# Patient Record
Sex: Female | Born: 1990 | Race: White | Hispanic: No | Marital: Single | Smoking: Former smoker
Health system: Southern US, Community
[De-identification: ages and names within clinical notes are randomized; demographics above are authoritative.]

## PROBLEM LIST (undated history)

## (undated) ENCOUNTER — Inpatient Hospital Stay (HOSPITAL_COMMUNITY): Payer: No Typology Code available for payment source

## (undated) DIAGNOSIS — N390 Urinary tract infection, site not specified: Secondary | ICD-10-CM

## (undated) DIAGNOSIS — F1110 Opioid abuse, uncomplicated: Secondary | ICD-10-CM

## (undated) HISTORY — PX: WISDOM TOOTH EXTRACTION: SHX21

## (undated) HISTORY — PX: INDUCED ABORTION: SHX677

---

## 2011-07-22 ENCOUNTER — Encounter (HOSPITAL_BASED_OUTPATIENT_CLINIC_OR_DEPARTMENT_OTHER): Payer: Self-pay | Admitting: *Deleted

## 2011-07-22 ENCOUNTER — Emergency Department (HOSPITAL_BASED_OUTPATIENT_CLINIC_OR_DEPARTMENT_OTHER)
Admission: EM | Admit: 2011-07-22 | Discharge: 2011-07-22 | Disposition: A | Discharge status: Home / Self Care | Payer: No Typology Code available for payment source | Attending: Emergency Medicine | Admitting: Emergency Medicine

## 2011-07-22 ENCOUNTER — Emergency Department (HOSPITAL_BASED_OUTPATIENT_CLINIC_OR_DEPARTMENT_OTHER): Payer: No Typology Code available for payment source

## 2011-07-22 DIAGNOSIS — M25519 Pain in unspecified shoulder: Secondary | ICD-10-CM | POA: Insufficient documentation

## 2011-07-22 DIAGNOSIS — S139XXA Sprain of joints and ligaments of unspecified parts of neck, initial encounter: Secondary | ICD-10-CM | POA: Insufficient documentation

## 2011-07-22 DIAGNOSIS — S161XXA Strain of muscle, fascia and tendon at neck level, initial encounter: Secondary | ICD-10-CM

## 2011-07-22 DIAGNOSIS — M549 Dorsalgia, unspecified: Secondary | ICD-10-CM | POA: Insufficient documentation

## 2011-07-22 DIAGNOSIS — Y92410 Unspecified street and highway as the place of occurrence of the external cause: Secondary | ICD-10-CM | POA: Insufficient documentation

## 2011-07-22 MED ORDER — HYDROCODONE-ACETAMINOPHEN 5-500 MG PO TABS: 1.0000 | ORAL_TABLET | Freq: Four times a day (QID) | ORAL | Status: AC | PRN

## 2011-07-22 MED ORDER — METHOCARBAMOL 500 MG PO TABS: 500.0000 mg | ORAL_TABLET | Freq: Two times a day (BID) | ORAL | Status: AC
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
## 2011-07-22 NOTE — ED Provider Notes (Signed)
Medical screening examination/treatment/procedure(s) were performed by non-physician practitioner and as supervising physician I was immediately available for consultation/collaboration.    Kerriann Kamphuis L Audelia Knape, MD 07/22/11 1820 

## 2011-11-30 ENCOUNTER — Encounter (HOSPITAL_COMMUNITY): Payer: Self-pay

## 2011-11-30 ENCOUNTER — Observation Stay (HOSPITAL_COMMUNITY)
Admission: EM | Admit: 2011-11-30 | Discharge: 2011-11-30 | Disposition: A | Discharge status: Home / Self Care | Payer: Self-pay | Attending: Emergency Medicine | Admitting: Emergency Medicine

## 2011-11-30 DIAGNOSIS — IMO0002 Reserved for concepts with insufficient information to code with codable children: Principal | ICD-10-CM | POA: Insufficient documentation

## 2011-11-30 DIAGNOSIS — L02419 Cutaneous abscess of limb, unspecified: Secondary | ICD-10-CM

## 2011-11-30 DIAGNOSIS — F1110 Opioid abuse, uncomplicated: Secondary | ICD-10-CM | POA: Insufficient documentation

## 2011-11-30 HISTORY — DX: Opioid abuse, uncomplicated: F11.10

## 2011-11-30 LAB — BASIC METABOLIC PANEL
CO2: 29 mEq/L (ref 19–32)
Calcium: 10.1 mg/dL (ref 8.4–10.5)
Creatinine, Ser: 0.7 mg/dL (ref 0.50–1.10)
Glucose, Bld: 97 mg/dL (ref 70–99)

## 2011-11-30 LAB — CBC WITH DIFFERENTIAL/PLATELET
Basophils Absolute: 0.1 10*3/uL (ref 0.0–0.1)
Eosinophils Relative: 1 % (ref 0–5)
HCT: 41.1 % (ref 36.0–46.0)
Lymphocytes Relative: 23 % (ref 12–46)
Lymphs Abs: 2.3 10*3/uL (ref 0.7–4.0)
MCV: 82.5 fL (ref 78.0–100.0)
Monocytes Absolute: 0.5 10*3/uL (ref 0.1–1.0)
RDW: 12.1 % (ref 11.5–15.5)
WBC: 9.7 10*3/uL (ref 4.0–10.5)

## 2011-11-30 MED ORDER — CLINDAMYCIN PHOSPHATE 600 MG/50ML IV SOLN
600.0000 mg | Freq: Three times a day (TID) | INTRAVENOUS | Status: DC
  Administered 2011-11-30: 600 mg via INTRAVENOUS
  Filled 2011-11-30: qty 50

## 2011-11-30 MED ORDER — ONDANSETRON HCL 4 MG/2ML IJ SOLN
4.0000 mg | Freq: Four times a day (QID) | INTRAMUSCULAR | Status: DC | PRN
  Administered 2011-11-30: 4 mg via INTRAVENOUS
  Filled 2011-11-30: qty 2

## 2011-11-30 MED ORDER — SODIUM CHLORIDE 0.9 % IV SOLN
Freq: Once | INTRAVENOUS | Status: AC
  Administered 2011-11-30: 14:00:00 via INTRAVENOUS

## 2011-11-30 MED ORDER — ZOLPIDEM TARTRATE 5 MG PO TABS: 5.0000 mg | ORAL_TABLET | Freq: Every evening | ORAL | Status: DC | PRN

## 2011-11-30 MED ORDER — CLINDAMYCIN PHOSPHATE 600 MG/50ML IV SOLN
600.0000 mg | Freq: Once | INTRAVENOUS | Status: AC
  Administered 2011-11-30: 600 mg via INTRAVENOUS
  Filled 2011-11-30: qty 50

## 2011-11-30 MED ORDER — MORPHINE SULFATE 4 MG/ML IJ SOLN
4.0000 mg | INTRAMUSCULAR | Status: DC | PRN
  Administered 2011-11-30: 4 mg via INTRAVENOUS
  Filled 2011-11-30: qty 1

## 2011-11-30 MED ORDER — LORAZEPAM 1 MG PO TABS
1.0000 mg | ORAL_TABLET | Freq: Once | ORAL | Status: AC
  Administered 2011-11-30: 1 mg via ORAL
  Filled 2011-11-30: qty 1

## 2011-11-30 MED ORDER — IBUPROFEN 800 MG PO TABS
800.0000 mg | ORAL_TABLET | Freq: Once | ORAL | Status: AC
  Administered 2011-11-30: 800 mg via ORAL
  Filled 2011-11-30: qty 1

## 2011-11-30 MED ORDER — ACETAMINOPHEN 325 MG PO TABS
650.0000 mg | ORAL_TABLET | ORAL | Status: DC | PRN
Start: 2011-11-30 — End: 2011-11-30

## 2011-11-30 MED ORDER — MORPHINE SULFATE 4 MG/ML IJ SOLN
4.0000 mg | Freq: Once | INTRAMUSCULAR | Status: AC
  Administered 2011-11-30: 4 mg via INTRAVENOUS
  Filled 2011-11-30: qty 1

## 2011-11-30 MED ORDER — CLINDAMYCIN HCL 150 MG PO CAPS: 300.0000 mg | ORAL_CAPSULE | Freq: Four times a day (QID) | ORAL | Status: DC

## 2011-11-30 MED ORDER — OXYCODONE-ACETAMINOPHEN 5-325 MG PO TABS
1.0000 | ORAL_TABLET | Freq: Once | ORAL | Status: AC
  Administered 2011-11-30: 1 via ORAL
  Filled 2011-11-30: qty 1

## 2011-11-30 NOTE — Progress Notes (Signed)
Utilization review completed.  

## 2011-11-30 NOTE — ED Provider Notes (Signed)
1500 Report received from [place] PA for this 21yo female with RUE abscess from IV drug abuse coming from pod A Dr. Bernette Mayers.  Patient is anxious and wants to leave.  I reassured her she needs to stay to get one more dose of IV clindamycin.  I also have her rx being filled by the pharmacy now to ensure she will have it.  Eating her lunch presently.  Abscess draining and we will do wound care and have her come back tomorrow for recheck.    Remi Haggard, NP 12/02/11 1601

## 2011-11-30 NOTE — ED Provider Notes (Signed)
History     CSN: [id number]  Arrival date & time 11/30/11  4098   First MD Initiated Contact with Patient 11/30/11 [phone]      Chief Complaint  Patient presents with  . Cellulitis    (Consider location/radiation/quality/duration/timing/severity/associated sxs/prior treatment) HPI  21 year old female with history of heroin abuse presents complaining of pain and swelling to the right forearm. Patient admits to injecting IV heroin to her extremities. Her last use was a week ago. For the past week she has been noticing swelling and redness to her right forearm. Describe pain as sharp and throbbing sensation worsening with pressure. Pain is gradual onset, persistent, and worsening. She denies fever, chills, headache, chest pain, shortness of breath. She reports she is currently in rehabilitation and is now at the methadone clinic. She was prescribed Septra for the skin infection but it has not improved. She denies prior history of abscess. She denies history of immunocompromise, such as HIV, hepatitis, or diabetes.  History reviewed. No pertinent past medical history.  History reviewed. No pertinent past surgical history.  History reviewed. No pertinent family history.  History  Substance Use Topics  . Smoking status: Current Every Day Smoker -- 0.5 packs/day  . Smokeless tobacco: Not on file  . Alcohol Use: No    OB History    Grav Para Term Preterm Abortions TAB SAB Ect Mult Living                  Review of Systems  Constitutional: Negative for fever.  HENT: Negative for neck stiffness.   Musculoskeletal: Negative for joint swelling.  Skin: Positive for rash.  Neurological: Negative for numbness.    Allergies  Review of patient's allergies indicates no known allergies.  Home Medications   Current Outpatient Rx  Name Route Sig Dispense Refill  . IBUPROFEN 200 MG PO TABS Oral Take 400 mg by mouth every 6 (six) hours as needed. Patient used this medication for her back  pain.    Marland Kitchen MELATONIN ER 3 MG PO TBCR Oral Take 1 tablet by mouth daily as needed. Patient used this medication to aid with sleep.      BP 126/84  Temp 98.3 F (36.8 C) (Oral)  Resp 18  SpO2 100%  Physical Exam  Nursing note and vitals reviewed. Constitutional: She is oriented to person, place, and time. She appears well-developed and well-nourished. No distress.  HENT:  Head: Atraumatic.  Eyes: Conjunctivae normal are normal.  Neck: Neck supple.  Neurological: She is alert and oriented to person, place, and time.  Skin: Skin is warm. Rash (R forearm: large area of induration, fluctuance, with surrounding erythema to mid dorsal region.  ttp) noted.  Psychiatric: She has a normal mood and affect.    ED Course  Procedures (including critical care time)  Labs Reviewed - No data to display No results found.   No diagnosis found.  INCISION AND DRAINAGE Performed by: Fayrene Helper Consent: Verbal consent obtained. Risks and benefits: risks, benefits and alternatives were discussed Type: abscess  Body area: R forearm, dorsal region  Anesthesia: local infiltration  Local anesthetic: lidocaine 2% w epinephrine  Anesthetic total: 6 ml  Complexity: complex Blunt dissection to break up loculations  Drainage: purulent  Drainage amount: large  Packing material: 1/4 in iodoform gauze  Patient tolerance: Patient tolerated the procedure well with no immediate complications.  1. Abscess cellulitis of R forearm   MDM  Abscess to R forearm at site of IVDU. Successful I&D.  Pt has surrounding cellulitic changes.  Margin were drawn, will start clinda IV and place pt on cellulitis protocol.  No systemic infection.  Discussed care with my attending.    8:39 AM Report given to CDU PA, who will continue cellulitis protocol.  Pt may be d/c once 2 doses of clinda IV given.  Return in 2 days for recheck and packing removal.    BP 126/84  Temp 98.3 F (36.8 C) (Oral)  Resp 18  SpO2  100%        Fayrene Helper, PA-C 11/30/11 [phone]

## 2011-11-30 NOTE — ED Notes (Signed)
Pt presents to ED w/large abscess to RPFA x1 week, pt reports hx of IV drug use, reports using that extremity to inject IV Heroin and noticed a "skin colored raised area" after. Pt reports she was in rehab and is now at the Methadone clinic, pt was prescribed Septra while in rehab. Center of tissue is black

## 2011-11-30 NOTE — ED Notes (Signed)
PA at the bedside to perform I&D of R forearm abscess. Pt tolerating without difficulty at the time.

## 2011-11-30 NOTE — ED Provider Notes (Signed)
9:19 AM Assumed care of patient in the CDU.  Patient is currently on the Cellulitis Protocol.  Plan is for the patient to be discharged home once two doses of IV Clindamycin have been completed as long as the area of Cellulitis does not worsen.  Patient will be discharged home with prescription for Clindamycin.  Patient presented today with a large abscess on her right forearm that had been present for the past week.  Abscess with surrounding cellulitis.  Abscess has been I & D by Fayrene Helper prior to arrival in the CDU.  Patient is an IV drug user.  Patient is afebrile.  No acute distress. Heart RRR, Lungs CTAB, Right forearm with abscess that is draining a small amount of purulent fluid with surrounding erythema and induration.  Neurovascularly intact.    12:30 PM Reassessed patient.  Patient is resting comfortably.  No acute distress.  Patient has completed one round of Clindamycin.  Area of cellulitis is well within the marked area.  Neurovascularly intact.  Will reassess patient after her second dose.  3:23 PM Patient signed out to Levy Sjogren, NP.  Patient is awaiting second dose of Clindamycin IV.  Pascal Lux Washburn, PA-C 11/30/11 1523

## 2011-11-30 NOTE — ED Provider Notes (Signed)
Medical screening examination/treatment/procedure(s) were performed by non-physician practitioner and as supervising physician I was immediately available for consultation/collaboration.   Patricia Watson B. Bernette Mayers, MD 11/30/11 [phone]

## 2011-12-02 ENCOUNTER — Emergency Department (HOSPITAL_COMMUNITY)
Admission: EM | Admit: 2011-12-02 | Discharge: 2011-12-02 | Disposition: A | Discharge status: Home / Self Care | Payer: Self-pay | Attending: Emergency Medicine | Admitting: Emergency Medicine

## 2011-12-02 ENCOUNTER — Encounter (HOSPITAL_COMMUNITY): Payer: Self-pay | Admitting: *Deleted

## 2011-12-02 DIAGNOSIS — IMO0002 Reserved for concepts with insufficient information to code with codable children: Secondary | ICD-10-CM | POA: Insufficient documentation

## 2011-12-02 DIAGNOSIS — F17200 Nicotine dependence, unspecified, uncomplicated: Secondary | ICD-10-CM | POA: Insufficient documentation

## 2011-12-02 DIAGNOSIS — Z4800 Encounter for change or removal of nonsurgical wound dressing: Secondary | ICD-10-CM | POA: Insufficient documentation

## 2011-12-02 NOTE — ED Provider Notes (Signed)
History     CSN: [id number]  Arrival date & time 12/02/11  2107   First MD Initiated Contact with Patient 12/02/11 2125      Chief Complaint  Patient presents with  . Wound Check    (Consider location/radiation/quality/duration/timing/severity/associated sxs/prior treatment) HPI Comments: 21 year old female presents the emergency department for packing removal and abscess drain on her right forearm 2 days ago. Denies having any problems with movement here. Denies fever, chills, nausea or vomiting. She is taking clindamycin as directed. Denies any drainage from the wound. States she's been keeping the wound dry and covered.  The history is provided by the patient.    Past Medical History  Diagnosis Date  . Narcotic abuse     History reviewed. No pertinent past surgical history.  History reviewed. No pertinent family history.  History  Substance Use Topics  . Smoking status: Current Every Day Smoker -- 0.5 packs/day  . Smokeless tobacco: Not on file  . Alcohol Use: No    OB History    Grav Para Term Preterm Abortions TAB SAB Ect Mult Living                  Review of Systems  Constitutional: Negative for fever and chills.  Gastrointestinal: Negative for nausea and vomiting.  Skin: Negative for color change.  Neurological: Negative for light-headedness.    Allergies  Review of patient's allergies indicates no known allergies.  Home Medications   Current Outpatient Rx  Name Route Sig Dispense Refill  . CLINDAMYCIN HCL 150 MG PO CAPS Oral Take 2 capsules (300 mg total) by mouth every 6 (six) hours. 80 capsule 0  . METHADONE HCL 5 MG PO TABS Oral Take 40 mg by mouth daily.       BP 101/60  Pulse 63  Temp 98.1 F (36.7 C) (Oral)  Resp 16  SpO2 100%  Physical Exam  Constitutional: She is oriented to person, place, and time. She appears well-developed and well-nourished. No distress.  HENT:  Head: Normocephalic and atraumatic.  Eyes: Conjunctivae normal  and EOM are normal.  Neck: Normal range of motion.  Cardiovascular: Normal rate, regular rhythm and normal heart sounds.   Pulmonary/Chest: Effort normal and breath sounds normal.  Neurological: She is alert and oriented to person, place, and time.  Skin: Skin is warm and dry. She is not diaphoretic.       1 cm diameter packed abscess without induration, drainage, or edema. Mild surrounding erythema. Packing removed and still without active drainage.   Psychiatric: She has a normal mood and affect. Her behavior is normal.    ED Course  Procedures (including critical care time)  Labs Reviewed - No data to display No results found.   1. Abscess packing removal       MDM  21 year old female with packing removed from abscess on right forearm. She didn't clindamycin. Erythema is much improved compared to the skin markings John at her last visit. No associated symptoms. She is afebrile in no apparent distress. She'll continue taking clindamycin.        Trevor Mace, PA-C 12/02/11 2146

## 2011-12-02 NOTE — ED Notes (Signed)
No rx.  Pt voiced understanding to f/u with PCP and return for worsening condition.  

## 2011-12-02 NOTE — ED Notes (Signed)
Pt states seen here for wound on right forearm. Pt wound was opened and packed pt told to come back to have packing removed.

## 2011-12-03 NOTE — ED Provider Notes (Signed)
Medical screening examination/treatment/procedure(s) were performed by non-physician practitioner and as supervising physician I was immediately available for consultation/collaboration.  Elaf Clauson, MD 12/03/11 0127 

## 2012-02-18 NOTE — L&D Delivery Note (Signed)
Delivery Note At 5:21 PM a viable female was delivered via Vaginal, Spontaneous Delivery (Presentation: Left Occiput Anterior).  APGAR: 8, 9; weight 6 lb 15 oz (3147 g).   Placenta status: Intact, Spontaneous.  Cord: 3 vessels with the following complications: None.    Anesthesia: Epidural  Episiotomy: None Lacerations: 2nd degree;Perineal Suture Repair: 3.0 vicryl Est. Blood Loss (mL): 500  Mom to postpartum.  Baby to nursery-stable.  Patricia Blackwell 11/18/2012, 7:11 PM

## 2012-04-03 ENCOUNTER — Encounter (HOSPITAL_BASED_OUTPATIENT_CLINIC_OR_DEPARTMENT_OTHER): Payer: Self-pay | Admitting: Emergency Medicine

## 2012-04-03 ENCOUNTER — Emergency Department (HOSPITAL_BASED_OUTPATIENT_CLINIC_OR_DEPARTMENT_OTHER): Payer: Medicaid Other

## 2012-04-03 ENCOUNTER — Emergency Department (HOSPITAL_BASED_OUTPATIENT_CLINIC_OR_DEPARTMENT_OTHER)
Admission: EM | Admit: 2012-04-03 | Discharge: 2012-04-03 | Disposition: A | Discharge status: Home / Self Care | Payer: Medicaid Other | Attending: Emergency Medicine | Admitting: Emergency Medicine

## 2012-04-03 DIAGNOSIS — R109 Unspecified abdominal pain: Secondary | ICD-10-CM | POA: Insufficient documentation

## 2012-04-03 DIAGNOSIS — O9989 Other specified diseases and conditions complicating pregnancy, childbirth and the puerperium: Secondary | ICD-10-CM | POA: Insufficient documentation

## 2012-04-03 DIAGNOSIS — O209 Hemorrhage in early pregnancy, unspecified: Secondary | ICD-10-CM

## 2012-04-03 DIAGNOSIS — F17200 Nicotine dependence, unspecified, uncomplicated: Secondary | ICD-10-CM | POA: Insufficient documentation

## 2012-04-03 DIAGNOSIS — Z79899 Other long term (current) drug therapy: Secondary | ICD-10-CM | POA: Insufficient documentation

## 2012-04-03 DIAGNOSIS — F1110 Opioid abuse, uncomplicated: Secondary | ICD-10-CM | POA: Insufficient documentation

## 2012-04-03 LAB — CBC WITH DIFFERENTIAL/PLATELET
Basophils Relative: 0 % (ref 0–1)
Eosinophils Absolute: 0.1 10*3/uL (ref 0.0–0.7)
HCT: 40.4 % (ref 36.0–46.0)
Hemoglobin: 14.8 g/dL (ref 12.0–15.0)
MCH: 29 pg (ref 26.0–34.0)
MCHC: 36.6 g/dL — ABNORMAL HIGH (ref 30.0–36.0)
Monocytes Absolute: 0.4 10*3/uL (ref 0.1–1.0)
Monocytes Relative: 5 % (ref 3–12)
Neutro Abs: 5.5 10*3/uL (ref 1.7–7.7)

## 2012-04-03 LAB — ABO/RH: Antibody Screen: NEGATIVE

## 2012-04-03 LAB — URINALYSIS, ROUTINE W REFLEX MICROSCOPIC
Glucose, UA: NEGATIVE mg/dL
Ketones, ur: 40 mg/dL — AB
Protein, ur: >300 mg/dL — AB

## 2012-04-03 LAB — WET PREP, GENITAL: Trich, Wet Prep: NONE SEEN

## 2012-04-03 LAB — URINE MICROSCOPIC-ADD ON

## 2012-04-03 MED ORDER — RHO D IMMUNE GLOBULIN 1500 UNIT/2ML IJ SOLN
INTRAMUSCULAR | Status: AC
  Administered 2012-04-03: 300 ug via INTRAMUSCULAR
  Filled 2012-04-03: qty 2

## 2012-04-03 MED ORDER — MORPHINE SULFATE 4 MG/ML IJ SOLN
4.0000 mg | Freq: Once | INTRAMUSCULAR | Status: AC
  Administered 2012-04-03: 4 mg via INTRAVENOUS
  Filled 2012-04-03: qty 1

## 2012-04-03 MED ORDER — SODIUM CHLORIDE 0.9 % IV SOLN
Freq: Once | INTRAVENOUS | Status: AC
  Administered 2012-04-03: 14:00:00 via INTRAVENOUS

## 2012-04-03 MED ORDER — RHO D IMMUNE GLOBULIN 1500 UNIT/2ML IJ SOLN
300.0000 ug | Freq: Once | INTRAMUSCULAR | Status: AC
  Administered 2012-04-03: 300 ug via INTRAMUSCULAR

## 2012-04-03 NOTE — ED Notes (Signed)
Pt is pregnant (unknown length of time, approx 6-8 weeks).  Pt states she started bleeding today, also having cramping.  Pt tearful, upset.

## 2012-04-03 NOTE — ED Provider Notes (Signed)
History     CSN: [id number]  Arrival date & time 04/03/12  1240   First MD Initiated Contact with Patient 04/03/12 1257      Chief Complaint  Patient presents with  . Vaginal Bleeding  . Abdominal Cramping    (Consider location/radiation/quality/duration/timing/severity/associated sxs/prior treatment) Patient is a 22 y.o. female presenting with vaginal bleeding. The history is provided by the patient. No language interpreter was used.  Vaginal Bleeding This is a new problem. The current episode started today. The problem occurs constantly. The problem has been gradually worsening. Associated symptoms include abdominal pain. Nothing aggravates the symptoms. She has tried nothing for the symptoms.   Pt reports she is having vaginal bleeding and severe pain.  Pt reports she has not had any prenatal care Past Medical History  Diagnosis Date  . Narcotic abuse     No past surgical history on file.  No family history on file.  History  Substance Use Topics  . Smoking status: Current Every Day Smoker -- 0.50 packs/day  . Smokeless tobacco: Not on file  . Alcohol Use: No    OB History   Grav Para Term Preterm Abortions TAB SAB Ect Mult Living   1               Review of Systems  Gastrointestinal: Positive for abdominal pain.  Genitourinary: Positive for vaginal bleeding.  All other systems reviewed and are negative.    Allergies  Review of patient's allergies indicates no known allergies.  Home Medications   Current Outpatient Rx  Name  Route  Sig  Dispense  Refill  . methadone (DOLOPHINE) 5 MG tablet   Oral   Take 40 mg by mouth daily.            BP 135/68  Pulse 100  Temp(Src) 98.5 F (36.9 C) (Oral)  Resp 16  Ht 5\' 4"  (1.626 m)  Wt 110 lb (49.896 kg)  BMI 18.87 kg/m2  SpO2 100%  LMP 06/25/2011  Physical Exam  Vitals reviewed. Constitutional: She is oriented to person, place, and time. She appears well-developed and well-nourished.  HENT:   Head: Normocephalic.  Right Ear: External ear normal.  Left Ear: External ear normal.  Mouth/Throat: Oropharynx is clear and moist.  Eyes: Pupils are equal, round, and reactive to light.  Neck: Normal range of motion.  Cardiovascular: Normal rate.   Pulmonary/Chest: Effort normal.  Abdominal: Soft.  Genitourinary: Vagina normal.  Small amount red vaginal blood, Os closed  Neurological: She is alert and oriented to person, place, and time.  Skin: Skin is warm.    ED Course  Procedures (including critical care time)  Labs Reviewed  URINALYSIS, ROUTINE W REFLEX MICROSCOPIC  PREGNANCY, URINE   No results found.   No diagnosis found.    MDM  I discussed with Dr. Eunice Blase gyn teaching service.   She advised to give pt rhogam.  Pt advised of possible impending SAB. Pt counseled on followup   Pt called and advised of uti.   Rx for amoxicillian 500mg  one po tid   Urine culture ordered     Elson Areas, PA 04/03/12 1704  Lonia Skinner [place], [place] 04/03/12 1745

## 2012-04-03 NOTE — ED Notes (Signed)
Pt discharged prior to receiving antibiotic prescription.  Called pt and verified pharmacy.  Called [facility] on [address], Amoxicillin 500 mg po three times a day for 10 days.  #30, no refills, for [place], [place].

## 2012-04-03 NOTE — ED Notes (Signed)
Patient ambulatory to restroom without difficulty or assistance.

## 2012-04-04 LAB — URINE CULTURE: Culture: NO GROWTH

## 2012-04-05 ENCOUNTER — Encounter (HOSPITAL_COMMUNITY): Payer: Self-pay | Admitting: *Deleted

## 2012-04-05 ENCOUNTER — Inpatient Hospital Stay (HOSPITAL_COMMUNITY)
Admission: AD | Admit: 2012-04-05 | Discharge: 2012-04-05 | Disposition: A | Discharge status: Home / Self Care | Payer: Medicaid Other | Source: Ambulatory Visit | Attending: Obstetrics & Gynecology | Admitting: Obstetrics & Gynecology

## 2012-04-05 DIAGNOSIS — R141 Gas pain: Secondary | ICD-10-CM | POA: Insufficient documentation

## 2012-04-05 DIAGNOSIS — R143 Flatulence: Secondary | ICD-10-CM | POA: Insufficient documentation

## 2012-04-05 DIAGNOSIS — N390 Urinary tract infection, site not specified: Secondary | ICD-10-CM

## 2012-04-05 DIAGNOSIS — R142 Eructation: Secondary | ICD-10-CM | POA: Insufficient documentation

## 2012-04-05 DIAGNOSIS — K5900 Constipation, unspecified: Secondary | ICD-10-CM | POA: Insufficient documentation

## 2012-04-05 HISTORY — DX: Urinary tract infection, site not specified: N39.0

## 2012-04-05 LAB — GC/CHLAMYDIA PROBE AMP
CT Probe RNA: NEGATIVE
GC Probe RNA: NEGATIVE

## 2012-04-05 MED ORDER — METRONIDAZOLE 500 MG PO TABS: 500.0000 mg | ORAL_TABLET | Freq: Two times a day (BID) | ORAL | Status: DC

## 2012-04-05 NOTE — MAU Note (Signed)
Pt seen at [facility] on the 15th.  For bleeding and cramping.  States is doing better small amt of spotting yesterday morning, none since.  Is taking the antibiotics for for the UTI.  Is feeling bloated today, constipation.

## 2012-04-05 NOTE — ED Provider Notes (Signed)
Medical screening examination/treatment/procedure(s) were performed by non-physician practitioner and as supervising physician I was immediately available for consultation/collaboration.   Joya Gaskins, MD 04/05/12 [phone]

## 2012-04-05 NOTE — MAU Provider Note (Signed)
  History     CSN: [id number]  Arrival date and time: 04/05/12 1142   None     Chief Complaint  Patient presents with  . Follow-up   HPI Pt is G3P0020 [redacted]w[redacted]d pregnant and presents for f/u after being seen at [facility] on  2/15 for bleeding and cramping.  She had a small amount of spotting yesterday morning and none since.  Pt denies any bleeding or cramping today.  Pt was told that she might be having a miscarriage and she was to f/u here.   Past Medical History  Diagnosis Date  . Narcotic abuse   . Urinary tract infection     Past Surgical History  Procedure Laterality Date  . Induced abortion      Family History  Problem Relation Age of Onset  . Heart disease Father     valve replaced  . Cancer Maternal Grandmother     lung  . Multiple sclerosis Paternal Grandmother     History  Substance Use Topics  . Smoking status: Former Smoker -- 0.50 packs/day    Types: Cigarettes  . Smokeless tobacco: Former Neurosurgeon    Quit date: 04/03/2012  . Alcohol Use: No    Allergies: No Known Allergies  Prescriptions prior to admission  Medication Sig Dispense Refill  . methadone (DOLOPHINE) 5 MG tablet Take 40 mg by mouth daily.         Review of Systems  Constitutional: Negative for fever and chills.  Gastrointestinal: Positive for constipation. Negative for nausea, vomiting, abdominal pain and diarrhea.  Genitourinary: Negative for dysuria, urgency and frequency.  Neurological: Negative for headaches.   Physical Exam   Blood pressure 119/78, pulse 75, temperature 98.4 F (36.9 C), temperature source Oral, resp. rate 18, last menstrual period 06/25/2011.  Physical Exam  Nursing note and vitals reviewed. Constitutional: She is oriented to person, place, and time. She appears well-developed and well-nourished.  HENT:  Head: Normocephalic.  Eyes: Pupils are equal, round, and reactive to light.  Neck: Normal range of motion. Neck supple.  Cardiovascular: Normal  rate.   Respiratory: Effort normal.  GI: Soft.  Musculoskeletal: Normal range of motion.  Neurological: She is alert and oriented to person, place, and time.  Skin: Skin is warm and dry.  Psychiatric: She has a normal mood and affect.    MAU Course  Procedures From 2/15: Clinical Data: Bleeding, cramping  OBSTETRIC <14 WK [place] AND TRANSVAGINAL OB US  Technique: Both transabdominal and transvaginal ultrasound  examinations were performed for complete evaluation of the  gestation as well as the maternal uterus, adnexal regions, and  pelvic cul-de-sac.  Comparison: None.  Findings: There is a single intrauterine gestation, 7 weeks 6 days  by crown-rump length. Fetal heart rate 169 beats per minute. No  subchorionic hemorrhage.  Ovaries are symmetric in size and echotexture. No adnexal mass.  No free fluid.  IMPRESSION:  7-week-6-day intrauterine pregnancy with fetal heart rate 169 beats  per minute. No visualize subchorionic hemorrhage.  Original Report Authenticated By: Charlett Nose, M.D.  Reviewed results of ultrasound from 2/15 with pt and mother- no evidence of impending miscarriage Assessment and Plan  Bleeding in pregnancy Normal ultrasound F/u for prenatal care- pt may elect to go to [facility] 04/05/2012, 12:16 PM

## 2012-04-07 NOTE — MAU Provider Note (Signed)
Attestation of Attending Supervision of Advanced Practitioner (CNM/NP): Evaluation and management procedures were performed by the Advanced Practitioner under my supervision and collaboration.  I have reviewed the Advanced Practitioner's note and chart, and I agree with the management and plan.  HARRAWAY-SMITH, Aalyiah Camberos 9:33 AM

## 2012-04-12 ENCOUNTER — Ambulatory Visit (INDEPENDENT_AMBULATORY_CARE_PROVIDER_SITE_OTHER): Payer: Medicaid Other | Admitting: Advanced Practice Midwife

## 2012-04-12 ENCOUNTER — Encounter: Payer: Self-pay | Admitting: Advanced Practice Midwife

## 2012-04-12 VITALS — BP 131/86 | Wt 114.0 lb

## 2012-04-12 DIAGNOSIS — Z3490 Encounter for supervision of normal pregnancy, unspecified, unspecified trimester: Secondary | ICD-10-CM | POA: Insufficient documentation

## 2012-04-12 DIAGNOSIS — Z3480 Encounter for supervision of other normal pregnancy, unspecified trimester: Secondary | ICD-10-CM

## 2012-04-12 LAB — HIV ANTIBODY (ROUTINE TESTING W REFLEX): HIV: NONREACTIVE

## 2012-04-12 MED ORDER — RANITIDINE HCL 150 MG PO TABS: 150.0000 mg | ORAL_TABLET | Freq: Two times a day (BID) | ORAL | Status: DC

## 2012-04-12 NOTE — Progress Notes (Signed)
   Subjective:    Patricia Blackwell is a [id number] [redacted]w[redacted]d being seen today for her first obstetrical visit. Patient is not sure if she intends to breast feed. Pregnancy history fully reviewed. She has been seen in MAU for bleeding in pregnancy, had a normal u/s at 7 weeks, no ongoing bleeding. Treated for BV and UTI at that time.   Patricia Blackwell has a history of narcotic dependence (opiods - pills and occasional heroin use), started Methadone in October of last year and is doing well. She also recently quit smoking cigarettes when she discovered that she was pregnant, as well as stopping drinking sodas. She states she is very excited and concerned about this pregnancy.   Patient reports fatigue, heartburn and constipation.  Filed Vitals:   04/12/12 0912  BP: 131/86  Weight: 114 lb (51.71 kg)    HISTORY: OB History   Grav Para Term Preterm Abortions TAB SAB Ect Mult Living   3 0 0 0 2 1 1    0     # Outc Date GA Lbr Len/2nd Wgt Sex Del Anes PTL Lv   1 TAB            2 SAB            3 CUR              Past Medical History  Diagnosis Date  . Narcotic abuse   . Urinary tract infection    Past Surgical History  Procedure Laterality Date  . Induced abortion     Family History  Problem Relation Age of Onset  . Heart disease Father     valve replaced  . Cancer Maternal Grandmother     lung  . Multiple sclerosis Paternal Grandmother      Exam    Uterus:   below SP  Pelvic Exam: Deferred, recent exam in MAU  System:     Skin: normal coloration and turgor, no rashes    Neurologic: oriented, normal mood   Extremities: normal strength, tone, and muscle mass       Mouth/Teeth mucous membranes moist, pharynx normal without lesions       Cardiovascular: regular rate and rhythm   Respiratory:  appears well, vitals normal, no respiratory distress, acyanotic, normal RR   Abdomen: soft, non-tender; bowel sounds normal; no masses,  no organomegaly          Assessment:    Pregnancy:  [id number] Patient Active Problem List  Diagnosis  . Supervision of normal pregnancy  . Narcotic dependence        Plan:     Initial labs drawn. Flu shot today Prenatal vitamins. Rx ranitidine for heartburn Problem list reviewed and updated. Genetic Screening discussed Integrated Screen and Quad Screen: undecided.  Ultrasound discussed; fetal survey: at around 20 weeks.  Follow up in 4 weeks.    Patricia Blackwell 04/12/2012

## 2012-04-12 NOTE — Progress Notes (Signed)
P - 92 - Pt would like scan done today if possible - Pt states has been constipated and has had heartburn - Pt has Pap Smear in 2013

## 2012-04-12 NOTE — Patient Instructions (Signed)
Pregnancy - First Trimester During sexual intercourse, millions of sperm go into the vagina. Only 1 sperm will penetrate and fertilize the female egg while it is in the Fallopian tube. One week later, the fertilized egg implants into the wall of the uterus. An embryo begins to develop into a baby. At 6 to 8 weeks, the eyes and face are formed and the heartbeat can be seen on ultrasound. At the end of 12 weeks (first trimester), all the baby's organs are formed. Now that you are pregnant, you will want to do everything you can to have a healthy baby. Two of the most important things are to get good prenatal care and follow your caregiver's instructions. Prenatal care is all the medical care you receive before the baby's birth. It is given to prevent, find, and treat problems during the pregnancy and childbirth. PRENATAL EXAMS  During prenatal visits, your weight, blood pressure and urine are checked. This is done to make sure you are healthy and progressing normally during the pregnancy.  A pregnant woman should gain 25 to 35 pounds during the pregnancy. However, if you are over weight or underweight, your caregiver will advise you regarding your weight.  Your caregiver will ask and answer questions for you.  Blood work, cervical cultures, other necessary tests and a Pap test are done during your prenatal exams. These tests are done to check on your health and the probable health of your baby. Tests are strongly recommended and done for HIV with your permission. This is the virus that causes AIDS. These tests are done because medications can be given to help prevent your baby from being born with this infection should you have been infected without knowing it. Blood work is also used to find out your blood type, previous infections and follow your blood levels (hemoglobin).  Low hemoglobin (anemia) is common during pregnancy. Iron and vitamins are given to help prevent this. Later in the pregnancy,  blood tests for diabetes will be done along with any other tests if any problems develop. You may need tests to make sure you and the baby are doing well.  You may need other tests to make sure you and the baby are doing well. CHANGES DURING THE FIRST TRIMESTER (THE FIRST 3 MONTHS OF PREGNANCY) Your body goes through many changes during pregnancy. They vary from person to person. Talk to your caregiver about changes you notice and are concerned about. Changes can include:  Your menstrual period stops.  The egg and sperm carry the genes that determine what you look like. Genes from you and your partner are forming a baby. The female genes determine whether the baby is a boy or a girl.  Your body increases in girth and you may feel bloated.  Feeling sick to your stomach (nauseous) and throwing up (vomiting). If the vomiting is uncontrollable, call your caregiver.  Your breasts will begin to enlarge and become tender.  Your nipples may stick out more and become darker.  The need to urinate more. Painful urination may mean you have a bladder infection.  Tiring easily.  Loss of appetite.  Cravings for certain kinds of food.  At first, you may gain or lose a couple of pounds.  You may have changes in your emotions from day to day (excited to be pregnant or concerned something may go wrong with the pregnancy and baby).  You may have more vivid and strange dreams. HOME CARE INSTRUCTIONS   It is very important   to avoid all smoking, alcohol and un-prescribed drugs during your pregnancy. These affect the formation and growth of the baby. Avoid chemicals while pregnant to ensure the delivery of a healthy infant.  Start your prenatal visits by the 12th week of pregnancy. They are usually scheduled monthly at first, then more often in the last 2 months before delivery. Keep your caregiver's appointments. Follow your caregiver's instructions regarding medication use, blood and lab tests, exercise,  and diet.  During pregnancy, you are providing food for you and your baby. Eat regular, well-balanced meals. Choose foods such as meat, fish, milk and other low fat dairy products, vegetables, fruits, and whole-grain breads and cereals. Your caregiver will tell you of the ideal weight gain.  You can help morning sickness by keeping soda crackers at the bedside. Eat a couple before arising in the morning. You may want to use the crackers without salt on them.  Eating 4 to 5 small meals rather than 3 large meals a day also may help the nausea and vomiting.  Drinking liquids between meals instead of during meals also seems to help nausea and vomiting.  A physical sexual relationship may be continued throughout pregnancy if there are no other problems. Problems may be early (premature) leaking of amniotic fluid from the membranes, vaginal bleeding, or belly (abdominal) pain.  Exercise regularly if there are no restrictions. Check with your caregiver or physical therapist if you are unsure of the safety of some of your exercises. Greater weight gain will occur in the last 2 trimesters of pregnancy. Exercising will help:  Control your weight.  Keep you in shape.  Prepare you for labor and delivery.  Help you lose your pregnancy weight after you deliver your baby.  Wear a good support or jogging bra for breast tenderness during pregnancy. This may help if worn during sleep too.  Ask when prenatal classes are available. Begin classes when they are offered.  Do not use hot tubs, steam rooms or saunas.  Wear your seat belt when driving. This protects you and your baby if you are in an accident.  Avoid raw meat, uncooked cheese, cat litter boxes and soil used by cats throughout the pregnancy. These carry germs that can cause birth defects in the baby.  The first trimester is a good time to visit your dentist for your dental health. Getting your teeth cleaned is OK. Use a softer toothbrush and  brush gently during pregnancy.  Ask for help if you have financial, counseling or nutritional needs during pregnancy. Your caregiver will be able to offer counseling for these needs as well as refer you for other special needs.  Do not take any medications or herbs unless told by your caregiver.  Inform your caregiver if there is any mental or physical domestic violence.  Make a list of emergency phone numbers of family, friends, hospital, and police and fire departments.  Write down your questions. Take them to your prenatal visit.  Do not douche.  Do not cross your legs.  If you have to stand for long periods of time, rotate you feet or take small steps in a circle.  You may have more vaginal secretions that may require a sanitary pad. Do not use tampons or scented sanitary pads. MEDICATIONS AND DRUG USE IN PREGNANCY  Take prenatal vitamins as directed. The vitamin should contain 1 milligram of folic acid. Keep all vitamins out of reach of children. Only a couple vitamins or tablets containing iron may be   fatal to a baby or young child when ingested.  Avoid use of all medications, including herbs, over-the-counter medications, not prescribed or suggested by your caregiver. Only take over-the-counter or prescription medicines for pain, discomfort, or fever as directed by your caregiver. Do not use aspirin, ibuprofen, or naproxen unless directed by your caregiver.  Let your caregiver also know about herbs you may be using.  Alcohol is related to a number of birth defects. This includes fetal alcohol syndrome. All alcohol, in any form, should be avoided completely. Smoking will cause low birth rate and premature babies.  Street or illegal drugs are very harmful to the baby. They are absolutely forbidden. A baby born to an addicted mother will be addicted at birth. The baby will go through the same withdrawal an adult does.  Let your caregiver know about any medications that you have to  take and for what reason you take them. MISCARRIAGE IS COMMON DURING PREGNANCY A miscarriage does not mean you did something wrong. It is not a reason to worry about getting pregnant again. Your caregiver will help you with questions you may have. If you have a miscarriage, you may need minor surgery. SEEK MEDICAL CARE IF:  You have any concerns or worries during your pregnancy. It is better to call with your questions if you feel they cannot wait, rather than worry about them. SEEK IMMEDIATE MEDICAL CARE IF:   An unexplained oral temperature above 102 F (38.9 C) develops, or as your caregiver suggests.  You have leaking of fluid from the vagina (birth canal). If leaking membranes are suspected, take your temperature and inform your caregiver of this when you call.  There is vaginal spotting or bleeding. Notify your caregiver of the amount and how many pads are used.  You develop a bad smelling vaginal discharge with a change in the color.  You continue to feel sick to your stomach (nauseated) and have no relief from remedies suggested. You vomit blood or coffee ground-like materials.  You lose more than 2 pounds of weight in 1 week.  You gain more than 2 pounds of weight in 1 week and you notice swelling of your face, hands, feet, or legs.  You gain 5 pounds or more in 1 week (even if you do not have swelling of your hands, face, legs, or feet).  You get exposed to German measles and have never had them.  You are exposed to fifth disease or chickenpox.  You develop belly (abdominal) pain. Round ligament discomfort is a common non-cancerous (benign) cause of abdominal pain in pregnancy. Your caregiver still must evaluate this.  You develop headache, fever, diarrhea, pain with urination, or shortness of breath.  You fall or are in a car accident or have any kind of trauma.  There is mental or physical violence in your home. Document Released: 01/28/2001 Document Revised: 04/28/2011  Document Reviewed: 08/01/2008 Exit[employer] Patient Information 2013 [employer].  Breastfeeding Deciding to breastfeed is one of the best choices you can make for you and your baby. The information that follows gives a brief overview of the benefits of breastfeeding as well as common topics surrounding breastfeeding. BENEFITS OF BREASTFEEDING For the baby  The first milk (colostrum) helps the baby's digestive system function better.   There are antibodies in the mother's milk that help the baby fight off infections.   The baby has a lower incidence of asthma, allergies, and sudden infant death syndrome (SIDS).   The nutrients in breast milk   are better for the baby than infant formulas, and breast milk helps the baby's brain grow better.   Babies who breastfeed have less gas, colic, and constipation.  For the mother  Breastfeeding helps develop a very special bond between the mother and her baby.   Breastfeeding is convenient, always available at the correct temperature, and costs nothing.   Breastfeeding burns calories in the mother and helps her lose weight that was gained during pregnancy.   Breastfeeding makes the uterus contract back down to normal size faster and slows bleeding following delivery.   Breastfeeding mothers have a lower risk of developing breast cancer.  BREASTFEEDING FREQUENCY  A healthy, full-term baby may breastfeed as often as every hour or space his or her feedings to every 3 hours.   Watch your baby for signs of hunger. Nurse your baby if he or she shows signs of hunger. How often you nurse will vary from baby to baby.   Nurse as often as the baby requests, or when you feel the need to reduce the fullness of your breasts.   Awaken the baby if it has been 3 4 hours since the last feeding.   Frequent feeding will help the mother make more milk and will help prevent problems, such as sore nipples and engorgement of the breasts.  BABY'S  POSITION AT THE BREAST  Whether lying down or sitting, be sure that the baby's tummy is facing your tummy.   Support the breast with 4 fingers underneath the breast and the thumb above. Make sure your fingers are well away from the nipple and baby's mouth.   Stroke the baby's lips gently with your finger or nipple.   When the baby's mouth is open wide enough, place all of your nipple and as much of the areola as possible into your baby's mouth.   Pull the baby in close so the tip of the nose and the baby's cheeks touch the breast during the feeding.  FEEDINGS AND SUCTION  The length of each feeding varies from baby to baby and from feeding to feeding.   The baby must suck about 2 3 minutes for your milk to get to him or her. This is called a "let down." For this reason, allow the baby to feed on each breast as long as he or she wants. Your baby will end the feeding when he or she has received the right balance of nutrients.   To break the suction, put your finger into the corner of the baby's mouth and slide it between his or her gums before removing your breast from his or her mouth. This will help prevent sore nipples.  HOW TO TELL WHETHER YOUR BABY IS GETTING ENOUGH BREAST MILK. Wondering whether or not your baby is getting enough milk is a common concern among mothers. You can be assured that your baby is getting enough milk if:   Your baby is actively sucking and you hear swallowing.   Your baby seems relaxed and satisfied after a feeding.   Your baby nurses at least 8 12 times in a 24 hour time period. Nurse your baby until he or she unlatches or falls asleep at the first breast (at least 10 20 minutes), then offer the second side.   Your baby is wetting 5 6 disposable diapers (6 8 cloth diapers) in a 24 hour period by 5 6 days of age.   Your baby is having at least 3 4 stools every 24 hours   for the first 6 weeks. The stool should be soft and yellow.   Your baby  should gain 4 7 ounces per week after he or she is 4 days old.   Your breasts feel softer after nursing.  REDUCING BREAST ENGORGEMENT  In the first week after your baby is born, you may experience signs of breast engorgement. When breasts are engorged, they feel heavy, warm, full, and may be tender to the touch. You can reduce engorgement if you:   Nurse frequently, every 2 3 hours. Mothers who breastfeed early and often have fewer problems with engorgement.   Place light ice packs on your breasts for 10 20 minutes between feedings. This reduces swelling. Wrap the ice packs in a lightweight towel to protect your skin. Bags of frozen vegetables work well for this purpose.   Take a warm shower or apply warm, moist heat to your breast for 5 10 minutes just before each feeding. This increases circulation and helps the milk flow.   Gently massage your breast before and during the feeding. Using your finger tips, massage from the chest wall towards your nipple in a circular motion.   Make sure that the baby empties at least one breast at every feeding before switching sides.   Use a breast pump to empty the breasts if your baby is sleepy or not nursing well. You may also want to pump if you are returning to work oryou feel you are getting engorged.   Avoid bottle feeds, pacifiers, or supplemental feedings of water or juice in place of breastfeeding. Breast milk is all the food your baby needs. It is not necessary for your baby to have water or formula. In fact, to help your breasts make more milk, it is best not to give your baby supplemental feedings during the early weeks.   Be sure the baby is latched on and positioned properly while breastfeeding.   Wear a supportive bra, avoiding underwire styles.   Eat a balanced diet with enough fluids.   Rest often, relax, and take your prenatal vitamins to prevent fatigue, stress, and anemia.  If you follow these suggestions, your  engorgement should improve in 24 48 hours. If you are still experiencing difficulty, call your lactation consultant or caregiver.  CARING FOR YOURSELF Take care of your breasts  Bathe or shower daily.   Avoid using soap on your nipples.   Start feedings on your left breast at one feeding and on your right breast at the next feeding.   You will notice an increase in your milk supply 2 5 days after delivery. You may feel some discomfort from engorgement, which makes your breasts very firm and often tender. Engorgement "peaks" out within 24 48 hours. In the meantime, apply warm moist towels to your breasts for 5 10 minutes before feeding. Gentle massage and expression of some milk before feeding will soften your breasts, making it easier for your baby to latch on.   Wear a well-fitting nursing bra, and air dry your nipples for a 3 4minutes after each feeding.   Only use cotton bra pads.   Only use pure lanolin on your nipples after nursing. You do not need to wash it off before feeding the baby again. Another option is to express a few drops of breast milk and gently massage it into your nipples.  Take care of yourself  Eat well-balanced meals and nutritious snacks.   Drinking milk, fruit juice, and water to satisfy your thirst (  about 8 glasses a day).   Get plenty of rest.  Avoid foods that you notice affect the baby in a bad way.  SEEK MEDICAL CARE IF:   You have difficulty with breastfeeding and need help.   You have a hard, red, sore area on your breast that is accompanied by a fever.   Your baby is too sleepy to eat well or is having trouble sleeping.   Your baby is wetting less than 6 diapers a day, by 5 days of age.   Your baby's skin or white part of his or her eyes is more yellow than it was in the hospital.   You feel depressed.  Document Released: 02/03/2005 Document Revised: 08/05/2011 Document Reviewed: 05/04/2011 [employer] Patient Information 2013  [employer].  

## 2012-04-13 LAB — OBSTETRIC PANEL
Basophils Absolute: 0 10*3/uL (ref 0.0–0.1)
Eosinophils Absolute: 0.1 10*3/uL (ref 0.0–0.7)
Eosinophils Relative: 2 % (ref 0–5)
Lymphocytes Relative: 36 % (ref 12–46)
MCH: 28.7 pg (ref 26.0–34.0)
MCV: 80.7 fL (ref 78.0–100.0)
Platelets: 210 10*3/uL (ref 150–400)
RDW: 13.6 % (ref 11.5–15.5)
WBC: 6.8 10*3/uL (ref 4.0–10.5)

## 2012-04-13 LAB — PRENATAL ANTIBODY IDENTIFICATION

## 2012-04-13 LAB — ANTIBODY TITER (PRENATAL TITER)

## 2012-04-13 LAB — GC/CHLAMYDIA PROBE AMP: CT Probe RNA: NEGATIVE

## 2012-04-14 LAB — CULTURE, OB URINE: Colony Count: NO GROWTH

## 2012-04-16 ENCOUNTER — Encounter: Payer: Self-pay | Admitting: Advanced Practice Midwife

## 2012-04-22 ENCOUNTER — Encounter: Payer: Self-pay | Admitting: Obstetrics & Gynecology

## 2012-04-22 ENCOUNTER — Ambulatory Visit (INDEPENDENT_AMBULATORY_CARE_PROVIDER_SITE_OTHER): Payer: Medicaid Other | Admitting: Obstetrics & Gynecology

## 2012-04-22 VITALS — BP 119/70 | Temp 96.7°F | Wt 117.0 lb

## 2012-04-22 DIAGNOSIS — Z3480 Encounter for supervision of other normal pregnancy, unspecified trimester: Secondary | ICD-10-CM

## 2012-04-22 DIAGNOSIS — R300 Dysuria: Secondary | ICD-10-CM

## 2012-04-22 LAB — POCT URINALYSIS DIPSTICK
Blood, UA: NEGATIVE
Protein, UA: NEGATIVE
Spec Grav, UA: >=1.03
Urobilinogen, UA: NEGATIVE
pH, UA: 5

## 2012-04-22 NOTE — Progress Notes (Signed)
Pt thinks she has yeast and or UTI   p-75

## 2012-04-22 NOTE — Progress Notes (Signed)
Routine visit. She is worried that her "UTI" has not gone away (urine culture negative last month) and also thinks she may have a yeast infection. She is doing well with smoking cessation, drinking lots of water. Speculum exam shows normal appearing vaginal discharge. She declines First screen.

## 2012-04-27 ENCOUNTER — Telehealth: Payer: Self-pay | Admitting: *Deleted

## 2012-04-27 LAB — WET PREP BY MOLECULAR PROBE: Gardnerella vaginalis: POSITIVE — AB

## 2012-04-27 MED ORDER — METRONIDAZOLE 500 MG PO TABS: 500.0000 mg | ORAL_TABLET | Freq: Two times a day (BID) | ORAL | Status: DC

## 2012-04-27 NOTE — Telephone Encounter (Signed)
Called pt and informed her of test result showing +BV and Rx needed.  Pt voiced understanding and will obtain med.

## 2012-05-11 ENCOUNTER — Encounter: Payer: Medicaid Other | Admitting: Obstetrics & Gynecology

## 2012-05-11 ENCOUNTER — Telehealth: Payer: Self-pay | Admitting: *Deleted

## 2012-05-11 NOTE — Telephone Encounter (Signed)
Message copied by Granville Lewis on Tue May 11, 2012  9:00 AM ------      Message from: Allie Bossier      Created: Tue May 11, 2012  8:37 AM       She has BV. Can you please give her a script for flagyl 500 mg BID for 5 days?       Thanks ------

## 2012-05-11 NOTE — Telephone Encounter (Signed)
Pt notified of positive Gardnerella and she was given Flagyl at her last visit.

## 2012-05-20 ENCOUNTER — Encounter: Payer: Self-pay | Admitting: Advanced Practice Midwife

## 2012-05-20 ENCOUNTER — Ambulatory Visit (INDEPENDENT_AMBULATORY_CARE_PROVIDER_SITE_OTHER): Payer: Medicaid Other | Admitting: Advanced Practice Midwife

## 2012-05-20 VITALS — BP 135/89 | Wt 125.0 lb

## 2012-05-20 DIAGNOSIS — Z3480 Encounter for supervision of other normal pregnancy, unspecified trimester: Secondary | ICD-10-CM

## 2012-05-20 NOTE — Progress Notes (Signed)
Doing well.  Denies s/sx of UTI, vaginal itching, vaginal bleeding, LOF, regular contractions.  Anatomy scan scheduled in 4 weeks.

## 2012-05-20 NOTE — Progress Notes (Signed)
p-85 

## 2012-06-17 ENCOUNTER — Ambulatory Visit (INDEPENDENT_AMBULATORY_CARE_PROVIDER_SITE_OTHER): Payer: Medicaid Other | Admitting: Obstetrics & Gynecology

## 2012-06-17 ENCOUNTER — Encounter: Payer: Self-pay | Admitting: Obstetrics & Gynecology

## 2012-06-17 ENCOUNTER — Ambulatory Visit (HOSPITAL_COMMUNITY)
Admission: RE | Admit: 2012-06-17 | Discharge: 2012-06-17 | Disposition: A | Discharge status: Home / Self Care | Payer: Medicaid Other | Source: Ambulatory Visit | Attending: Advanced Practice Midwife | Admitting: Advanced Practice Midwife

## 2012-06-17 VITALS — BP 123/73 | Wt 134.0 lb

## 2012-06-17 DIAGNOSIS — Z363 Encounter for antenatal screening for malformations: Secondary | ICD-10-CM | POA: Insufficient documentation

## 2012-06-17 DIAGNOSIS — Z3491 Encounter for supervision of normal pregnancy, unspecified, first trimester: Secondary | ICD-10-CM

## 2012-06-17 DIAGNOSIS — Z3480 Encounter for supervision of other normal pregnancy, unspecified trimester: Secondary | ICD-10-CM

## 2012-06-17 DIAGNOSIS — O358XX0 Maternal care for other (suspected) fetal abnormality and damage, not applicable or unspecified: Secondary | ICD-10-CM | POA: Insufficient documentation

## 2012-06-17 DIAGNOSIS — Z1389 Encounter for screening for other disorder: Secondary | ICD-10-CM | POA: Insufficient documentation

## 2012-06-17 DIAGNOSIS — O99340 Other mental disorders complicating pregnancy, unspecified trimester: Secondary | ICD-10-CM | POA: Insufficient documentation

## 2012-06-17 DIAGNOSIS — O09899 Supervision of other high risk pregnancies, unspecified trimester: Secondary | ICD-10-CM | POA: Insufficient documentation

## 2012-06-17 NOTE — Progress Notes (Signed)
P - 89 - Pt has had numbness in both hands during the night

## 2012-06-17 NOTE — Progress Notes (Signed)
Routine visit. Denies OB problems. Per u/s, will need f/u u/s for complete anatomy. She declines a Quad screen today. Says that the results wouldn't change outcome. We discussed carpal tunnel syndrome and its treatment.

## 2012-06-29 ENCOUNTER — Ambulatory Visit (HOSPITAL_COMMUNITY)
Admission: RE | Admit: 2012-06-29 | Discharge: 2012-06-29 | Disposition: A | Discharge status: Home / Self Care | Payer: Medicaid Other | Source: Ambulatory Visit | Attending: Obstetrics & Gynecology | Admitting: Obstetrics & Gynecology

## 2012-06-29 DIAGNOSIS — O09899 Supervision of other high risk pregnancies, unspecified trimester: Secondary | ICD-10-CM | POA: Insufficient documentation

## 2012-06-29 DIAGNOSIS — O99340 Other mental disorders complicating pregnancy, unspecified trimester: Secondary | ICD-10-CM | POA: Insufficient documentation

## 2012-06-29 DIAGNOSIS — F1910 Other psychoactive substance abuse, uncomplicated: Secondary | ICD-10-CM | POA: Insufficient documentation

## 2012-06-29 DIAGNOSIS — Z3689 Encounter for other specified antenatal screening: Secondary | ICD-10-CM | POA: Insufficient documentation

## 2012-06-29 DIAGNOSIS — Z3491 Encounter for supervision of normal pregnancy, unspecified, first trimester: Secondary | ICD-10-CM

## 2012-07-15 ENCOUNTER — Encounter: Payer: Self-pay | Admitting: Obstetrics & Gynecology

## 2012-07-15 ENCOUNTER — Ambulatory Visit (INDEPENDENT_AMBULATORY_CARE_PROVIDER_SITE_OTHER): Payer: Medicaid Other | Admitting: Obstetrics & Gynecology

## 2012-07-15 VITALS — BP 115/73 | Wt 146.0 lb

## 2012-07-15 DIAGNOSIS — Z3491 Encounter for supervision of normal pregnancy, unspecified, first trimester: Secondary | ICD-10-CM

## 2012-07-15 DIAGNOSIS — Z3480 Encounter for supervision of other normal pregnancy, unspecified trimester: Secondary | ICD-10-CM

## 2012-07-15 NOTE — Progress Notes (Signed)
P-72 

## 2012-07-15 NOTE — Progress Notes (Signed)
Routine visit. Good FM. We discussed the recommendation for weight gain in pregnancy. She denies VB, ROM, or CTXs.

## 2012-08-12 ENCOUNTER — Encounter: Payer: Self-pay | Admitting: Obstetrics & Gynecology

## 2012-08-12 ENCOUNTER — Ambulatory Visit (INDEPENDENT_AMBULATORY_CARE_PROVIDER_SITE_OTHER): Payer: Medicaid Other | Admitting: Obstetrics & Gynecology

## 2012-08-12 VITALS — BP 122/74 | Wt 153.0 lb

## 2012-08-12 DIAGNOSIS — Z3490 Encounter for supervision of normal pregnancy, unspecified, unspecified trimester: Secondary | ICD-10-CM

## 2012-08-12 DIAGNOSIS — Z3480 Encounter for supervision of other normal pregnancy, unspecified trimester: Secondary | ICD-10-CM

## 2012-08-12 NOTE — Progress Notes (Signed)
Routine visit. Good FM. No OB problems. Glucola, rhogam, TDAP and labs at next visit.

## 2012-08-12 NOTE — Progress Notes (Signed)
p=86 

## 2012-08-26 ENCOUNTER — Ambulatory Visit (INDEPENDENT_AMBULATORY_CARE_PROVIDER_SITE_OTHER): Payer: Medicaid Other | Admitting: Obstetrics & Gynecology

## 2012-08-26 VITALS — BP 116/69 | Wt 157.0 lb

## 2012-08-26 DIAGNOSIS — Z3480 Encounter for supervision of other normal pregnancy, unspecified trimester: Secondary | ICD-10-CM

## 2012-08-26 DIAGNOSIS — Z23 Encounter for immunization: Secondary | ICD-10-CM

## 2012-08-26 DIAGNOSIS — O360990 Maternal care for other rhesus isoimmunization, unspecified trimester, not applicable or unspecified: Secondary | ICD-10-CM

## 2012-08-26 DIAGNOSIS — O360121 Maternal care for anti-D [Rh] antibodies, second trimester, fetus 1: Secondary | ICD-10-CM

## 2012-08-26 DIAGNOSIS — Z3482 Encounter for supervision of other normal pregnancy, second trimester: Secondary | ICD-10-CM

## 2012-08-26 MED ORDER — TETANUS-DIPHTH-ACELL PERTUSSIS 5-2.5-18.5 LF-MCG/0.5 IM SUSP
0.5000 mL | Freq: Once | INTRAMUSCULAR | Status: AC
  Administered 2012-08-26: 0.5 mL via INTRAMUSCULAR

## 2012-08-26 MED ORDER — RHO D IMMUNE GLOBULIN 1500 UNIT/2ML IJ SOLN
300.0000 ug | Freq: Once | INTRAMUSCULAR | Status: AC
  Administered 2012-08-26: 300 ug via INTRAMUSCULAR

## 2012-08-26 NOTE — Addendum Note (Signed)
Addended by: Granville Lewis on: 08/26/2012 05:05 PM   Modules accepted: Orders

## 2012-08-26 NOTE — Progress Notes (Signed)
p-75  28 week labs today and Rhophylac

## 2012-08-26 NOTE — Progress Notes (Signed)
Routine visit. Good FM. No problems. TDAP, rhogam, and labs today.

## 2012-08-27 LAB — RPR

## 2012-08-27 LAB — ANTIBODY SCREEN: Antibody Screen: NEGATIVE

## 2012-08-27 LAB — HIV ANTIBODY (ROUTINE TESTING W REFLEX): HIV: NONREACTIVE

## 2012-08-27 LAB — GLUCOSE TOLERANCE, 1 HOUR (50G) W/O FASTING: Glucose, 1 Hour GTT: 77 mg/dL (ref 70–140)

## 2012-08-27 LAB — CBC
Hemoglobin: 11.9 g/dL — ABNORMAL LOW (ref 12.0–15.0)
RBC: 3.97 MIL/uL (ref 3.87–5.11)

## 2012-09-16 ENCOUNTER — Ambulatory Visit (INDEPENDENT_AMBULATORY_CARE_PROVIDER_SITE_OTHER): Admitting: Obstetrics & Gynecology

## 2012-09-16 VITALS — BP 127/68 | Wt 166.0 lb

## 2012-09-16 DIAGNOSIS — Z3493 Encounter for supervision of normal pregnancy, unspecified, third trimester: Secondary | ICD-10-CM

## 2012-09-16 DIAGNOSIS — Z3480 Encounter for supervision of other normal pregnancy, unspecified trimester: Secondary | ICD-10-CM

## 2012-09-16 NOTE — Progress Notes (Signed)
Routine visit. Good FM. No problems except heart burn. I have recommended OTC zantac/tagamet.

## 2012-09-16 NOTE — Progress Notes (Signed)
p=91 

## 2012-09-30 ENCOUNTER — Ambulatory Visit (INDEPENDENT_AMBULATORY_CARE_PROVIDER_SITE_OTHER): Payer: Medicaid Other | Admitting: Obstetrics & Gynecology

## 2012-09-30 VITALS — BP 116/70 | Wt 169.0 lb

## 2012-09-30 DIAGNOSIS — F1920 Other psychoactive substance dependence, uncomplicated: Secondary | ICD-10-CM

## 2012-09-30 DIAGNOSIS — Z3403 Encounter for supervision of normal first pregnancy, third trimester: Secondary | ICD-10-CM

## 2012-09-30 DIAGNOSIS — Z3400 Encounter for supervision of normal first pregnancy, unspecified trimester: Secondary | ICD-10-CM

## 2012-09-30 MED ORDER — OMEPRAZOLE 20 MG PO CPDR: 20.0000 mg | DELAYED_RELEASE_CAPSULE | Freq: Every day | ORAL | Status: DC

## 2012-09-30 NOTE — Progress Notes (Signed)
P= 80 

## 2012-09-30 NOTE — Progress Notes (Signed)
NST today.  prilosec for heart burn

## 2012-09-30 NOTE — Addendum Note (Signed)
Addended by: Arne Cleveland on: 09/30/2012 04:52 PM   Modules accepted: Orders

## 2012-10-13 ENCOUNTER — Ambulatory Visit (INDEPENDENT_AMBULATORY_CARE_PROVIDER_SITE_OTHER): Payer: Medicaid Other | Admitting: Obstetrics and Gynecology

## 2012-10-13 VITALS — BP 128/76 | Wt 176.0 lb

## 2012-10-13 DIAGNOSIS — Z3403 Encounter for supervision of normal first pregnancy, third trimester: Secondary | ICD-10-CM

## 2012-10-13 DIAGNOSIS — Z3400 Encounter for supervision of normal first pregnancy, unspecified trimester: Secondary | ICD-10-CM

## 2012-10-13 NOTE — Progress Notes (Signed)
P = 92 

## 2012-10-13 NOTE — Progress Notes (Signed)
Doing well. Plans epidural. Has done classes. Will breastfeed.

## 2012-10-28 ENCOUNTER — Encounter: Payer: Self-pay | Admitting: Obstetrics & Gynecology

## 2012-10-28 ENCOUNTER — Ambulatory Visit (INDEPENDENT_AMBULATORY_CARE_PROVIDER_SITE_OTHER): Payer: Medicaid Other | Admitting: Obstetrics & Gynecology

## 2012-10-28 VITALS — BP 120/69 | Wt 181.0 lb

## 2012-10-28 DIAGNOSIS — Z3493 Encounter for supervision of normal pregnancy, unspecified, third trimester: Secondary | ICD-10-CM

## 2012-10-28 DIAGNOSIS — Z3492 Encounter for supervision of normal pregnancy, unspecified, second trimester: Secondary | ICD-10-CM

## 2012-10-28 DIAGNOSIS — Z3480 Encounter for supervision of other normal pregnancy, unspecified trimester: Secondary | ICD-10-CM

## 2012-10-28 LAB — OB RESULTS CONSOLE GC/CHLAMYDIA: Gonorrhea: NEGATIVE

## 2012-10-28 NOTE — Progress Notes (Signed)
Routine visit. Good FM. Cervical cultures today. Labor precautions reviewed. No problems

## 2012-10-28 NOTE — Progress Notes (Signed)
P - 89 - Pt states she thinks she lost her mucus plug

## 2012-10-29 LAB — GC/CHLAMYDIA PROBE AMP: GC Probe RNA: NEGATIVE

## 2012-11-03 ENCOUNTER — Ambulatory Visit (INDEPENDENT_AMBULATORY_CARE_PROVIDER_SITE_OTHER): Payer: Medicaid Other | Admitting: Advanced Practice Midwife

## 2012-11-03 VITALS — BP 133/75 | Wt 183.0 lb

## 2012-11-03 DIAGNOSIS — F1920 Other psychoactive substance dependence, uncomplicated: Secondary | ICD-10-CM

## 2012-11-03 DIAGNOSIS — Z3480 Encounter for supervision of other normal pregnancy, unspecified trimester: Secondary | ICD-10-CM

## 2012-11-03 DIAGNOSIS — F1120 Opioid dependence, uncomplicated: Secondary | ICD-10-CM

## 2012-11-03 NOTE — Progress Notes (Signed)
P - 81 

## 2012-11-03 NOTE — Patient Instructions (Signed)
Vaginal Delivery  Your caregiver must first be sure you are in labor. Signs of labor include:   You may pass what is called "the mucus plug" before labor begins. This is a small amount of blood stained mucus.   Regular uterine contractions.   The time between contractions get closer together.   The discomfort and pain gradually gets more intense.   Pains are mostly located in the back.   Pains get worse when walking.   The cervix (the opening of the uterus) becomes thinner (begins to efface) and opens up (dilates).  Once you are in labor and admitted into the hospital or care center, your caregiver will do the following:   A complete physical examination.   Check your vital signs (blood pressure, pulse, temperature and the fetal heart rate).   Do a vaginal examination (using a sterile glove and lubricant) to determine:   The position (presentation) of the baby (head [vertex] or buttock first).   The level (station) of the baby's head in the birth canal.   The effacement and dilatation of the cervix.   You may have your pubic hair shaved and be given an enema depending on your caregiver and the circumstance.   An electronic monitor is usually placed on your abdomen. The monitor follows the length and intensity of the contractions, as well as the baby's heart rate.   Usually, your caregiver will insert an IV in your arm with a bottle of sugar water. This is done as a precaution so that medications can be given to you quickly during labor or delivery.  NORMAL LABOR AND DELIVERY IS DIVIDED UP INTO 3 STAGES:  First Stage  This is when regular contractions begin and the cervix begins to efface and dilate. This stage can last from 3 to 15 hours. The end of the first stage is when the cervix is 100% effaced and 10 centimeters dilated. Pain medications may be given by    Injection (morphine, demerol, etc.)    Regional anesthesia (spinal, caudal or epidural, anesthetics given in different locations of the spine). Paracervical pain medication may be given, which is an injection of and anesthetic on each side of the cervix.  A pregnant woman may request to have "Natural Childbirth" which is not to have any medications or anesthesia during her labor and delivery.  Second Stage  This is when the baby comes down through the birth canal (vagina) and is born. This can take 1 to 4 hours. As the baby's head comes down through the birth canal, you may feel like you are going to have a bowel movement. You will get the urge to bear down and push until the baby is delivered. As the baby's head is being delivered, the caregiver will decide if an episiotomy (a cut in the perineum and vagina area) is needed to prevent tearing of the tissue in this area. The episiotomy is sewn up after the delivery of the baby and placenta. Sometimes a mask with nitrous oxide is given for the mother to breath during the delivery of the baby to help if there is too much pain. The end of Stage 2 is when the baby is fully delivered. Then when the umbilical cord stops pulsating it is clamped and cut.  Third Stage  The third stage begins after the baby is completely delivered and ends after the placenta (afterbirth) is delivered. This usually takes 5 to 30 minutes. After the placenta is delivered, a medication is given   either by intravenous or injection to help contract the uterus and prevent bleeding. The third stage is not painful and pain medication is usually not necessary. If an episiotomy was done, it is repaired at this time.  After the delivery, the mother is watched and monitored closely for 1 to 2 hours to make sure there is no postpartum bleeding (hemorrhage). If there is a lot of bleeding, medication is given to contract the uterus and stop the bleeding.  Document Released: 11/13/2007 Document Revised: 10/29/2011 Document Reviewed: 11/13/2007   Exit[employer] Patient Information 2014 [employer].

## 2012-11-03 NOTE — Progress Notes (Signed)
Doing well. Not many contractions. Labor precautions reviewed

## 2012-11-09 ENCOUNTER — Encounter: Payer: Self-pay | Admitting: Obstetrics & Gynecology

## 2012-11-11 ENCOUNTER — Ambulatory Visit (INDEPENDENT_AMBULATORY_CARE_PROVIDER_SITE_OTHER): Payer: Medicaid Other | Admitting: Obstetrics & Gynecology

## 2012-11-11 VITALS — BP 113/70 | Wt 186.0 lb

## 2012-11-11 DIAGNOSIS — O360990 Maternal care for other rhesus isoimmunization, unspecified trimester, not applicable or unspecified: Secondary | ICD-10-CM

## 2012-11-11 DIAGNOSIS — O360131 Maternal care for anti-D [Rh] antibodies, third trimester, fetus 1: Secondary | ICD-10-CM

## 2012-11-11 DIAGNOSIS — Z6791 Unspecified blood type, Rh negative: Secondary | ICD-10-CM | POA: Insufficient documentation

## 2012-11-11 NOTE — Progress Notes (Signed)
p=76 

## 2012-11-11 NOTE — Progress Notes (Signed)
No complaints.  Reviewed signs and symptoms of labor.  RTC 1 week.

## 2012-11-17 ENCOUNTER — Encounter (HOSPITAL_COMMUNITY): Payer: Self-pay | Admitting: *Deleted

## 2012-11-17 ENCOUNTER — Inpatient Hospital Stay (HOSPITAL_COMMUNITY)
Admission: AD | Admit: 2012-11-17 | Discharge: 2012-11-20 | DRG: 775 | Disposition: A | Discharge status: Home / Self Care | Payer: Medicaid Other | Source: Ambulatory Visit | Attending: Family Medicine | Admitting: Family Medicine

## 2012-11-17 DIAGNOSIS — Z3493 Encounter for supervision of normal pregnancy, unspecified, third trimester: Secondary | ICD-10-CM

## 2012-11-17 NOTE — MAU Note (Signed)
Pt reprot uc's q 5 min. Denies LOF or Vaginal bleeding.

## 2012-11-17 NOTE — MAU Note (Signed)
Plan of care d/w Pincus Badder. Pt to be reexamined in one hour, if no cervical change and has a reactive tracing, pt can be discharged home.

## 2012-11-18 ENCOUNTER — Encounter (HOSPITAL_COMMUNITY): Payer: Self-pay | Admitting: *Deleted

## 2012-11-18 ENCOUNTER — Encounter (HOSPITAL_COMMUNITY): Payer: Self-pay | Admitting: Anesthesiology

## 2012-11-18 ENCOUNTER — Inpatient Hospital Stay (HOSPITAL_COMMUNITY): Payer: Medicaid Other

## 2012-11-18 ENCOUNTER — Encounter: Payer: Medicaid Other | Admitting: Obstetrics & Gynecology

## 2012-11-18 ENCOUNTER — Inpatient Hospital Stay (HOSPITAL_COMMUNITY): Payer: Medicaid Other | Admitting: Anesthesiology

## 2012-11-18 LAB — CBC
Hemoglobin: 11.8 g/dL — ABNORMAL LOW (ref 12.0–15.0)
MCH: 29.6 pg (ref 26.0–34.0)
Platelets: 195 10*3/uL (ref 150–400)
RBC: 3.99 MIL/uL (ref 3.87–5.11)
RDW: 12.6 % (ref 11.5–15.5)
WBC: 9.9 10*3/uL (ref 4.0–10.5)

## 2012-11-18 LAB — RPR: RPR Ser Ql: NONREACTIVE

## 2012-11-18 MED ORDER — METHADONE HCL 10 MG/ML PO CONC
160.0000 mg | Freq: Every day | ORAL | Status: DC
  Administered 2012-11-19 – 2012-11-20 (×2): 160 mg via ORAL
  Filled 2012-11-18 (×3): qty 16

## 2012-11-18 MED ORDER — EPHEDRINE 5 MG/ML INJ
10.0000 mg | INTRAVENOUS | Status: DC | PRN
  Filled 2012-11-18 (×2): qty 4

## 2012-11-18 MED ORDER — ONDANSETRON HCL 4 MG/2ML IJ SOLN: 4.0000 mg | Freq: Four times a day (QID) | INTRAMUSCULAR | Status: DC | PRN

## 2012-11-18 MED ORDER — METHADONE HCL 10 MG/ML PO CONC
160.0000 mg | ORAL | Status: DC
  Administered 2012-11-18: 160 mg via ORAL
  Filled 2012-11-18 (×2): qty 16

## 2012-11-18 MED ORDER — LIDOCAINE HCL (PF) 1 % IJ SOLN
INTRAMUSCULAR | Status: DC | PRN
  Administered 2012-11-18: 9 mL
  Administered 2012-11-18: 8 mL

## 2012-11-18 MED ORDER — TERBUTALINE SULFATE 1 MG/ML IJ SOLN: 0.2500 mg | Freq: Once | INTRAMUSCULAR | Status: DC | PRN

## 2012-11-18 MED ORDER — DIPHENHYDRAMINE HCL 50 MG/ML IJ SOLN: 12.5000 mg | INTRAMUSCULAR | Status: DC | PRN

## 2012-11-18 MED ORDER — ZOLPIDEM TARTRATE 5 MG PO TABS: 5.0000 mg | ORAL_TABLET | Freq: Every evening | ORAL | Status: DC | PRN

## 2012-11-18 MED ORDER — DIBUCAINE 1 % RE OINT
1.0000 "application " | TOPICAL_OINTMENT | RECTAL | Status: DC | PRN
  Filled 2012-11-18: qty 28

## 2012-11-18 MED ORDER — INFLUENZA VAC SPLIT QUAD 0.5 ML IM SUSP
0.5000 mL | INTRAMUSCULAR | Status: AC
  Administered 2012-11-19: 0.5 mL via INTRAMUSCULAR
  Filled 2012-11-18: qty 0.5

## 2012-11-18 MED ORDER — SENNOSIDES-DOCUSATE SODIUM 8.6-50 MG PO TABS
2.0000 | ORAL_TABLET | ORAL | Status: DC
  Administered 2012-11-19: 2 via ORAL

## 2012-11-18 MED ORDER — SODIUM BICARBONATE 8.4 % IV SOLN
INTRAVENOUS | Status: DC | PRN
  Administered 2012-11-18 (×2): 5 mL via EPIDURAL

## 2012-11-18 MED ORDER — CITRIC ACID-SODIUM CITRATE 334-500 MG/5ML PO SOLN: 30.0000 mL | ORAL | Status: DC | PRN

## 2012-11-18 MED ORDER — ONDANSETRON HCL 4 MG PO TABS: 4.0000 mg | ORAL_TABLET | ORAL | Status: DC | PRN

## 2012-11-18 MED ORDER — METHADONE 0.4 MG/ML ORAL SOLUTION: 160.0000 mg | Freq: Every day | ORAL | Status: DC

## 2012-11-18 MED ORDER — IBUPROFEN 600 MG PO TABS
600.0000 mg | ORAL_TABLET | Freq: Four times a day (QID) | ORAL | Status: DC
  Administered 2012-11-18 – 2012-11-20 (×8): 600 mg via ORAL
  Filled 2012-11-18 (×7): qty 1

## 2012-11-18 MED ORDER — MISOPROSTOL 50MCG HALF TABLET
50.0000 ug | ORAL_TABLET | Freq: Once | ORAL | Status: AC
  Administered 2012-11-18: 50 ug via ORAL
  Filled 2012-11-18: qty 1

## 2012-11-18 MED ORDER — PNEUMOCOCCAL VAC POLYVALENT 25 MCG/0.5ML IJ INJ
0.5000 mL | INJECTION | INTRAMUSCULAR | Status: AC
  Administered 2012-11-19: 0.5 mL via INTRAMUSCULAR
  Filled 2012-11-18: qty 0.5

## 2012-11-18 MED ORDER — OXYTOCIN BOLUS FROM INFUSION
500.0000 mL | INTRAVENOUS | Status: DC
  Administered 2012-11-18: 500 mL via INTRAVENOUS

## 2012-11-18 MED ORDER — PHENYLEPHRINE 40 MCG/ML (10ML) SYRINGE FOR IV PUSH (FOR BLOOD PRESSURE SUPPORT)
80.0000 ug | PREFILLED_SYRINGE | INTRAVENOUS | Status: DC | PRN
  Filled 2012-11-18 (×2): qty 5

## 2012-11-18 MED ORDER — OXYTOCIN 40 UNITS IN LACTATED RINGERS INFUSION - SIMPLE MED
62.5000 mL/h | INTRAVENOUS | Status: DC
  Filled 2012-11-18: qty 1000

## 2012-11-18 MED ORDER — LIDOCAINE HCL (PF) 1 % IJ SOLN
30.0000 mL | INTRAMUSCULAR | Status: DC | PRN
  Filled 2012-11-18: qty 30

## 2012-11-18 MED ORDER — METHADONE 0.4 MG/ML ORAL SOLUTION
160.0000 mg | ORAL | Status: DC
  Filled 2012-11-18: qty 400

## 2012-11-18 MED ORDER — EPHEDRINE 5 MG/ML INJ: 10.0000 mg | INTRAVENOUS | Status: DC | PRN

## 2012-11-18 MED ORDER — LACTATED RINGERS IV SOLN
500.0000 mL | Freq: Once | INTRAVENOUS | Status: AC
  Administered 2012-11-18: 500 mL via INTRAVENOUS

## 2012-11-18 MED ORDER — DIPHENHYDRAMINE HCL 25 MG PO CAPS: 25.0000 mg | ORAL_CAPSULE | Freq: Four times a day (QID) | ORAL | Status: DC | PRN

## 2012-11-18 MED ORDER — MISOPROSTOL 200 MCG PO TABS
1000.0000 ug | ORAL_TABLET | Freq: Once | ORAL | Status: AC
  Administered 2012-11-18: 1000 ug via RECTAL

## 2012-11-18 MED ORDER — WITCH HAZEL-GLYCERIN EX PADS: 1.0000 "application " | MEDICATED_PAD | CUTANEOUS | Status: DC | PRN

## 2012-11-18 MED ORDER — ONDANSETRON HCL 4 MG/2ML IJ SOLN: 4.0000 mg | INTRAMUSCULAR | Status: DC | PRN

## 2012-11-18 MED ORDER — TETANUS-DIPHTH-ACELL PERTUSSIS 5-2.5-18.5 LF-MCG/0.5 IM SUSP
0.5000 mL | Freq: Once | INTRAMUSCULAR | Status: DC
  Filled 2012-11-18: qty 0.5

## 2012-11-18 MED ORDER — SIMETHICONE 80 MG PO CHEW: 80.0000 mg | CHEWABLE_TABLET | ORAL | Status: DC | PRN

## 2012-11-18 MED ORDER — METHADONE 0.4 MG/ML ORAL SOLUTION: 120.0000 mg | Freq: Every day | ORAL | Status: DC

## 2012-11-18 MED ORDER — ADULT MULTIVITAMIN W/MINERALS CH
1.0000 | ORAL_TABLET | Freq: Every day | ORAL | Status: DC
  Filled 2012-11-18 (×4): qty 1

## 2012-11-18 MED ORDER — FENTANYL 2.5 MCG/ML BUPIVACAINE 1/10 % EPIDURAL INFUSION (WH - ANES)
14.0000 mL/h | INTRAMUSCULAR | Status: DC | PRN
  Administered 2012-11-18: 14 mL/h via EPIDURAL
  Filled 2012-11-18 (×2): qty 125

## 2012-11-18 MED ORDER — LANOLIN HYDROUS EX OINT: TOPICAL_OINTMENT | CUTANEOUS | Status: DC | PRN

## 2012-11-18 MED ORDER — IBUPROFEN 600 MG PO TABS
600.0000 mg | ORAL_TABLET | Freq: Four times a day (QID) | ORAL | Status: DC | PRN
  Filled 2012-11-18: qty 1

## 2012-11-18 MED ORDER — ACETAMINOPHEN 325 MG PO TABS: 650.0000 mg | ORAL_TABLET | ORAL | Status: DC | PRN

## 2012-11-18 MED ORDER — MISOPROSTOL 25 MCG QUARTER TABLET: 50.0000 ug | ORAL_TABLET | Freq: Once | ORAL | Status: DC

## 2012-11-18 MED ORDER — LACTATED RINGERS IV SOLN
500.0000 mL | INTRAVENOUS | Status: DC | PRN
  Administered 2012-11-18: 300 mL via INTRAVENOUS
  Administered 2012-11-18: 500 mL via INTRAVENOUS
  Administered 2012-11-18: 300 mL via INTRAVENOUS

## 2012-11-18 MED ORDER — MISOPROSTOL 200 MCG PO TABS
ORAL_TABLET | ORAL | Status: AC
  Filled 2012-11-18: qty 5

## 2012-11-18 MED ORDER — OXYCODONE-ACETAMINOPHEN 5-325 MG PO TABS: 1.0000 | ORAL_TABLET | ORAL | Status: DC | PRN

## 2012-11-18 MED ORDER — LACTATED RINGERS IV SOLN
INTRAVENOUS | Status: DC
  Administered 2012-11-18 (×3): via INTRAVENOUS

## 2012-11-18 MED ORDER — PRENATAL MULTIVITAMIN CH
1.0000 | ORAL_TABLET | Freq: Every day | ORAL | Status: DC
  Administered 2012-11-19 – 2012-11-20 (×2): 1 via ORAL
  Filled 2012-11-18 (×3): qty 1

## 2012-11-18 MED ORDER — BENZOCAINE-MENTHOL 20-0.5 % EX AERO
1.0000 "application " | INHALATION_SPRAY | CUTANEOUS | Status: DC | PRN
  Filled 2012-11-18: qty 56

## 2012-11-18 MED ORDER — FENTANYL 2.5 MCG/ML BUPIVACAINE 1/10 % EPIDURAL INFUSION (WH - ANES)
INTRAMUSCULAR | Status: DC | PRN
  Administered 2012-11-18: 14 mL/h via EPIDURAL

## 2012-11-18 MED ORDER — PHENYLEPHRINE 40 MCG/ML (10ML) SYRINGE FOR IV PUSH (FOR BLOOD PRESSURE SUPPORT): 80.0000 ug | PREFILLED_SYRINGE | INTRAVENOUS | Status: DC | PRN

## 2012-11-18 MED ORDER — FENTANYL CITRATE 0.05 MG/ML IJ SOLN
100.0000 ug | INTRAMUSCULAR | Status: DC | PRN
  Administered 2012-11-18: 100 ug via INTRAVENOUS
  Filled 2012-11-18: qty 2

## 2012-11-18 NOTE — Anesthesia Preprocedure Evaluation (Signed)
Anesthesia Evaluation  Patient identified by MRN, date of birth, ID band Patient awake    Reviewed: Allergy & Precautions, H&P , NPO status , Patient's Chart, lab work & pertinent test results  Airway Mallampati: I TM Distance: >3 FB Neck ROM: full    Dental no notable dental hx.    Pulmonary neg pulmonary ROS,    Pulmonary exam normal       Cardiovascular negative cardio ROS      Neuro/Psych negative neurological ROS  negative psych ROS   GI/Hepatic negative GI ROS, Neg liver ROS,   Endo/Other  negative endocrine ROS  Renal/GU negative Renal ROS  negative genitourinary   Musculoskeletal negative musculoskeletal ROS (+)   Abdominal Normal abdominal exam  (+)   Peds  Hematology negative hematology ROS (+)   Anesthesia Other Findings   Reproductive/Obstetrics (+) Pregnancy                           Anesthesia Physical  Anesthesia Plan  ASA: II  Anesthesia Plan: Epidural   Post-op Pain Management:    Induction:   Airway Management Planned:   Additional Equipment:   Intra-op Plan:   Post-operative Plan:   Informed Consent: I have reviewed the patients History and Physical, chart, labs and discussed the procedure including the risks, benefits and alternatives for the proposed anesthesia with the patient or authorized representative who has indicated his/her understanding and acceptance.     Plan Discussed with:   Anesthesia Plan Comments:         Anesthesia Quick Evaluation  

## 2012-11-18 NOTE — Anesthesia Procedure Notes (Signed)
Epidural Patient location during procedure: OB Start time: 11/18/2012 9:49 AM End time: 11/18/2012 9:53 AM  Staffing Anesthesiologist: Sandrea Hughs Performed by: anesthesiologist   Preanesthetic Checklist Completed: patient identified, surgical consent, pre-op evaluation, timeout performed, IV checked, risks and benefits discussed and monitors and equipment checked  Epidural Patient position: sitting Prep: site prepped and draped and DuraPrep Patient monitoring: continuous pulse ox and blood pressure Approach: midline Injection technique: LOR air  Needle:  Needle type: Tuohy  Needle gauge: 17 G Needle length: 9 cm and 9 Needle insertion depth: 5 cm cm Catheter type: closed end flexible Catheter size: 19 Gauge Catheter at skin depth: 10 cm Test dose: negative and Other  Assessment Sensory level: T9 Events: blood not aspirated, injection not painful, no injection resistance, negative IV test and no paresthesia  Additional Notes Reason for block:procedure for pain

## 2012-11-18 NOTE — H&P (Signed)
Chart reviewed and agree with management and plan.  

## 2012-11-18 NOTE — H&P (Signed)
Patricia Blackwell is a 22 y.o. female G3P0020 at 40.4wks presenting for eval of contractions. Denies leaking or bldg. No N/V or H/A. She receives her prenatal [facility] at [facility] and it has been essentially unremarkable other than taking daily methadone 160mg  from [facility] (was on 100mg  prior to preg). History OB History   Grav Para Term Preterm Abortions TAB SAB Ect Mult Living   3 0 0 0 2 1 1    0     Past Medical History  Diagnosis Date  . Narcotic abuse     currently taking methadone  . Urinary tract infection    Past Surgical History  Procedure Laterality Date  . Induced abortion     Family History: family history includes Cancer in her maternal grandmother; Heart disease in her father; Multiple sclerosis in her paternal grandmother. Social History:  reports that she has quit smoking. Her smoking use included Cigarettes. She smoked 0.50 packs per day. She quit smokeless tobacco use about 7 months ago. She reports that she uses illicit drugs (IV and Opium). She reports that she does not drink alcohol.   Prenatal Transfer Tool  Maternal Diabetes: No Genetic Screening: Declined Maternal Ultrasounds/Referrals: Normal but spine not well-visualized Fetal Ultrasounds or other Referrals:  None Maternal Substance Abuse:  Yes:  Type: Methadone 160mg /d Significant Maternal Medications:  None Significant Maternal Lab Results:  Lab values include: Group B Strep negative Other Comments:  None  ROS  Dilation: Fingertip Effacement (%): 50 Station: 0 Exam by:: B Mosca RN Blood pressure 125/75, pulse 81, temperature 98.2 F (36.8 C), temperature source Oral, resp. rate 18, height 5\' 4"  (1.626 m), weight 83.008 kg (183 lb), last menstrual period 06/25/2011. Exam Physical Exam  Constitutional: She is oriented to person, place, and time. She appears well-developed.  HENT:  Head: Normocephalic.  Neck: Normal range of motion.  Cardiovascular: Normal rate.   Respiratory: Effort normal.  GI:  FHR  baseline 118, + accels, some variables Irreg ctx BPP 6/8 (-2 for movement)  Musculoskeletal: Normal range of motion.  Neurological: She is alert and oriented to person, place, and time.  Skin: Skin is warm and dry.  Psychiatric: She has a normal mood and affect. Her behavior is normal. Thought content normal.    Prenatal labs: ABO, Rh: AB/NEG/-- (02/24 1100) Antibody: NEG (07/10 1715) Rubella: 2.19 (02/24 1100) RPR: NON REAC (07/10 1635)  HBsAg: NEGATIVE (02/24 1100)  HIV: NON REACTIVE (07/10 1635)  GBS:   negative  Assessment/Plan: IUP at 40.4wks FHR changes  Will admit to [facility] to begin induction process Plan cytotec for ripening Continue with daily methadone at 160mg    Dezarae Mcclaran 11/18/2012, 3:20 AM

## 2012-11-18 NOTE — Progress Notes (Signed)
Patricia Blackwell is a 21 y.o. G3P0020 at [redacted]w[redacted]d admitted for IOL due to fetal tracing and decreased HR  Subjective: Doing well. Feeling lots of pressure. No vb. +LOF. +FM  Objective: BP 123/73  Pulse 81  Temp(Src) 98.3 F (36.8 C) (Oral)  Resp 18  Ht 5\' 4"  (1.626 m)  Wt 83.008 kg (183 lb)  BMI 31.4 kg/m2  SpO2 93%  LMP 06/25/2011      FHT:  FHR: 120 bpm, variability: moderate with periods of min,  accelerations:  Present,  decelerations:  Present occasional variables and earlies UC:   regular, every 2-4 minutes SVE:   Dilation: 5.5 Effacement (%): 100 Station: 0 Exam by:: Reola Calkins, MD  Labs: Lab Results  Component Value Date   WBC 9.9 11/18/2012   HGB 11.8* 11/18/2012   HCT 33.0* 11/18/2012   MCV 82.7 11/18/2012   PLT 195 11/18/2012    Assessment / Plan: IOL for FHR, doing well on pitocin  Labor: Progressing normally Fetal Wellbeing:  Category II Pain Control:  Epidural I/D:  n/a Anticipated MOD:  NSVD  Jereline Ticer L 11/18/2012, 1:14 PM

## 2012-11-19 MED ORDER — RHO D IMMUNE GLOBULIN 1500 UNIT/2ML IJ SOLN
300.0000 ug | Freq: Once | INTRAMUSCULAR | Status: AC
  Administered 2012-11-19: 300 ug via INTRAMUSCULAR
  Filled 2012-11-19: qty 2

## 2012-11-19 NOTE — Progress Notes (Signed)
Post Partum Day 1. Subjective: no complaints, up ad lib, voiding, tolerating PO and + flatus  Objective: Blood pressure 119/67, pulse 78, temperature 98.3 F (36.8 C), temperature source Oral, resp. rate 18, height 5\' 4"  (1.626 m), weight 83.008 kg (183 lb), last menstrual period 06/25/2011, SpO2 99.00%, unknown if currently breastfeeding.  Physical Exam:  General: alert, cooperative, appears stated age and no distress Lochia: appropriate Uterine Fundus: firm DVT Evaluation: Positive Homan's sign. Posterior calf tenderness present. Only on left side. Right side is normal with no tenderness, pain, or swelling.   Recent Labs  11/18/12 0320  HGB 11.8*  HCT 33.0*    Assessment/Plan: Contraception with Mirena. She is not breastfeeding. She wants to stay until her boy is ruled out for withdrawal.    LOS: 2 days   Patricia Blackwell 11/19/2012, 8:05 AM   Pt aware that she will be discharged tomorrow. I have seen and examined this patient and agree the above assessment. CRESENZO-DISHMAN,Patricia Blackwell 11/21/2012 9:51 PM

## 2012-11-19 NOTE — Anesthesia Postprocedure Evaluation (Signed)
Anesthesia Post Note  Patient: Patricia Blackwell  Procedure(s) Performed: * No procedures listed *  Anesthesia type: Epidural  Patient location: Mother/Baby  Post pain: Pain level controlled  Post assessment: Post-op Vital signs reviewed  Last Vitals:  Filed Vitals:   11/19/12 0611  BP: 119/67  Pulse: 78  Temp: 36.8 C  Resp: 18    Post vital signs: Reviewed  Level of consciousness:alert  Complications: No apparent anesthesia complications

## 2012-11-19 NOTE — Progress Notes (Signed)
UR completed 

## 2012-11-20 LAB — RH IG WORKUP (INCLUDES ABO/RH)
ABO/RH(D): AB NEG
Antibody Screen: NEGATIVE
Fetal Screen: NEGATIVE
Gestational Age(Wks): 40.4
Unit division: 0

## 2012-11-20 MED ORDER — IBUPROFEN 600 MG PO TABS: 600.0000 mg | ORAL_TABLET | Freq: Four times a day (QID) | ORAL | Status: AC

## 2012-11-20 NOTE — Discharge Summary (Signed)
Obstetric Discharge Summary Reason for Admission: induction of labor for fetal heartrate and BPP 6/8 (no movement) Prenatal Procedures: none Intrapartum Procedures: spontaneous vaginal delivery Postpartum Procedures: none Complications-Operative and Postpartum: 2nd degree perineal laceration Hemoglobin  Date Value Range Status  11/18/2012 11.8* 12.0 - 15.0 g/dL Final     HCT  Date Value Range Status  11/18/2012 33.0* 36.0 - 46.0 % Final   Patricia Blackwell is a 22yo G3P0020 was admitted at 40.4wks in the early morning of 10/2 for IOL due to fetal heartrate variables/low baseline. She had a BPP which showed 6/8 with -2 for fetal movement.  Her cx was unfavorable and so she received cytotec PO. Latent labor began shortly thereafter, and Pitocin was started for augmentation. She progressed to SVD by that evening. Her postpartum stay has been uneventful and she has been deemed to have received the full benefit of her hospital stay and will be discharged from our service. She will remain rooming-in with her infant until he completes his observation period for methadone withdrawal. She is bottlefeeding and desires an IUD for contraception.  Physical Exam:  General: alert, cooperative and no distress Heart: RRR Lungs: nl effort Lochia: appropriate Uterine Fundus: firm DVT Evaluation: No evidence of DVT seen on physical exam.  Discharge Diagnoses: Term Pregnancy-delivered  Discharge Information: Date: 11/20/2012 Activity: pelvic rest Diet: routine Medications: PNV, Ibuprofen and Methadone per [facility] Condition: stable Instructions: refer to practice specific booklet Discharge to: home after her time of rooming-in with infant Follow-up Information   Follow up with [facility] [place]. (Make a postpartum appointment for 4-6 weeks.)    Specialty:  Obstetrics and Gynecology   Contact information:   [address] [place] [address] [phone]       Newborn Data: Live born female  Birth Weight: 6 lb 15 oz (3147 g) APGAR: 8, 9  Home with mother after observation for methadone withdrawl is completed.Clelia Croft, Aldean Suddeth 11/20/2012, 7:49 AM

## 2012-11-21 NOTE — Progress Notes (Signed)
Clinical Social Work Department PSYCHOSOCIAL ASSESSMENT - MATERNAL/CHILD 11/21/2012  Patient:  Patricia Blackwell, Patricia Blackwell  Account Number:  [id number]  Admit Date:  11/17/2012  Marjo Bicker Name:   Samella Parr    Clinical Social Worker:  Makia Bossi, LCSW   Date/Time:  11/21/2012 02:45 PM  Date Referred:  11/19/2012   Referral source  Central Nursery     Referred reason  Substance Abuse   Other referral source:    I:  FAMILY / HOME ENVIRONMENT Child's legal guardian:  PARENT  Guardian - Name Guardian - Age Guardian - Address  [place],[address]alaya [address] [place], [place] [address]  Everardo All 30    Other household support members/support persons Name Relationship DOB  Marcellus Scott GRAND MOTHER    Other support:   Maternal grandparents and other relatives    II  PSYCHOSOCIAL DATA Information Source:    Event organiser Employment:   Maternal grandmother is the primary support   Financial resources:  Medicaid If Medicaid - [place]:   Other  [facility]   School / Grade:   Maternity Care Coordinator / Child Services Coordination / Early Interventions:  Cultural issues impacting care:    III  STRENGTHS Strengths  Adequate Resources  Home prepared for Child (including basic supplies)  Supportive family/friends   Strength comment:    IV  RISK FACTORS AND CURRENT PROBLEMS Current Problem:       V  SOCIAL WORK ASSESSMENT Met with mother who was pleasant and receptive to social work intervention.  She is a single parent with no other dependents.  FOB is not involved.  Mother states that she has a restraining order against him.  She is currently living with her mother who she describe as being very supportive.  Informed although FOB is not involved, his family is supportive.  Mother reports extensive family support.  Maternal grandmother was also present.   Mother states that she was addicted to opioids for a couple of years.  She seemed embarrassed by her substance abuse  history, and communicate guilt over her choices and how it is affecting her child.  She was very tearful.   Allowed her to vent.  Provided supportive feedback and discussed the importance of eventual self forgiveness. Informed that October 2013 she sought treatment for her addiction and is currently being treated at the [facility].  Mother states that she is on 160 mg per day and her goal is to wean completely off.  She also notes that she is participating in individual and group therapy.   She denies any use of cigarettes, alcohol, or illicit drug use during pregnancy.   Mother states that she has transportation to visit with newborn.  Informed her of social work Surveyor, mining.     VI SOCIAL WORK PLAN Social Work Plan  Psychosocial Support/Ongoing Assessment of Needs   Type of pt/family education:   If child protective services report - county:   If child protective services report - date:   Information/referral to community resources comment:   Other social work plan:   Pediatrician: Engineer, maintenance (IT)  Maccoy Haubner J, LCSW

## 2012-11-22 NOTE — Discharge Summary (Signed)
Attestation of Attending Supervision of Advanced Practitioner (PA/CNM/NP): Evaluation and management procedures were performed by the Advanced Practitioner under my supervision and collaboration.  I have reviewed the Advanced Practitioner's note and chart, and I agree with the management and plan.  Seymore Brodowski, MD, FACOG Attending Obstetrician & Gynecologist Faculty Practice, [facility]  

## 2012-11-29 ENCOUNTER — Ambulatory Visit: Payer: Self-pay

## 2012-11-29 NOTE — Lactation Note (Signed)
This note was copied from the chart of Patricia Blackwell. Lactation Consultation Note   Initial consult with this mom of a term baby, with NAS. Mom on methadone, and is thinking of beginning to provide EBM for her NICU baby, at 11 days post partum. I set up a DEP for mom, and had her pump. She expressed about 25 mls of milk in 5 minutes. Mom wanted to store the milk for now, and still decide if she wants to breast feed/proide EBM. She is concerned that EBM with Methadone will prolong her baby's withdrawal. I explained that if she does provide, it would have to be consistent amounts day to day, and this would actually hlpe her baby. I will toalk to mom more about this as she wants.  Patient Name: Patricia Breannah Kratt [id number] Date: 11/29/2012 Reason for consult: Initial assessment;NICU baby;Other (Comment) (term NAS)   Maternal Data Formula Feeding for Exclusion: Yes Reason for exclusion: Mother's choice to forumla feed on admision Infant to breast within first hour of birth: No Breastfeeding delayed due to:: Other (comment) (mom was going to formula feed) Has patient been taught Hand Expression?: Yes Does the patient have breastfeeding experience prior to this delivery?: No  Feeding Feeding Type: Bottle Fed - Formula Nipple Type: Regular Length of feed: 20 min  LATCH Score/Interventions                      Lactation Tools Discussed/Used Tools: Pump Breast pump type: Double-Electric Breast Pump WIC Program: Yes Pump Review: Setup, frequency, and cleaning;Milk Storage;Other (comment) (hand expression, NICU booklet) Initiated by:: c Nedra Hai RN LC Date initiated:: 11/29/12 (at 1600)   Consult Status Consult Status: Follow-up Follow-up type:  (prn in NICU)    Alfred Levins 11/29/2012, 4:48 PM

## 2012-12-29 ENCOUNTER — Encounter: Payer: Self-pay | Admitting: Obstetrics & Gynecology

## 2012-12-29 ENCOUNTER — Other Ambulatory Visit (HOSPITAL_COMMUNITY)
Admission: RE | Admit: 2012-12-29 | Discharge: 2012-12-29 | Disposition: A | Discharge status: Home / Self Care | Payer: Medicaid Other | Source: Ambulatory Visit | Attending: Obstetrics & Gynecology | Admitting: Obstetrics & Gynecology

## 2012-12-29 ENCOUNTER — Ambulatory Visit (INDEPENDENT_AMBULATORY_CARE_PROVIDER_SITE_OTHER): Payer: Medicaid Other | Admitting: Obstetrics & Gynecology

## 2012-12-29 VITALS — BP 129/78 | HR 91 | Resp 16 | Ht 64.0 in | Wt 164.0 lb

## 2012-12-29 DIAGNOSIS — Z30430 Encounter for insertion of intrauterine contraceptive device: Secondary | ICD-10-CM

## 2012-12-29 DIAGNOSIS — Z01419 Encounter for gynecological examination (general) (routine) without abnormal findings: Secondary | ICD-10-CM | POA: Insufficient documentation

## 2012-12-29 DIAGNOSIS — IMO0001 Reserved for inherently not codable concepts without codable children: Secondary | ICD-10-CM

## 2012-12-29 DIAGNOSIS — Z01812 Encounter for preprocedural laboratory examination: Secondary | ICD-10-CM

## 2012-12-29 DIAGNOSIS — Z0000 Encounter for general adult medical examination without abnormal findings: Secondary | ICD-10-CM

## 2012-12-29 MED ORDER — LEVONORGESTREL 20 MCG/24HR IU IUD
1.0000 | INTRAUTERINE_SYSTEM | Freq: Once | INTRAUTERINE | Status: AC
  Administered 2012-12-29: 11:00:00 1 via INTRAUTERINE

## 2012-12-29 NOTE — Patient Instructions (Signed)
Intrauterine Device Insertion Care After Refer to this sheet in the next few weeks. These instructions provide you with information on caring for yourself after your procedure. Your caregiver may also give you more specific instructions. Your treatment has been planned according to current medical practices, but problems sometimes occur. Call your caregiver if you have any problems or questions after your procedure. HOME CARE INSTRUCTIONS   Only take over-the-counter or prescription medicines for pain, discomfort, or fever as directed by your caregiver. Do not use aspirin. This may increase bleeding.  Check your IUD to make sure it is in place before you resume sexual activity. You should be able to feel the strings. If you cannot feel the strings, something may be wrong. The IUD may have fallen out of the uterus, or the uterus may have been punctured (perforated) during placement. Also, if the strings are getting longer, it may mean that the IUD is being forced out of the uterus. You no longer have full protection from pregnancy if any of these problems occur.  You may resume sexual intercourse if you are not having problems with the IUD. The IUD is considered immediately effective.  You may resume normal activities.  Keep all follow-up appointments to be sure your IUD has remained in place. After the first exam, yearly exams are advised, unless you cannot feel the strings of your IUD.  Continue to check that the IUD is still in place by feeling for the strings after every menstrual period. SEEK MEDICAL CARE IF:   You have bleeding that is heavier or lasts longer than a normal menstrual cycle.  You have a fever.  You have increasing cramps or abdominal pain not relieved with medicine.  You have abdominal pain that does not seem to be related to the same area of earlier cramping and pain.  You are lightheaded, unusually weak, or faint.  You have abnormal vaginal discharge or  smells.  You have pain during sexual intercourse.  You cannot feel the IUD strings, or the IUD string has gotten longer.  You feel the IUD at the opening of the cervix in the vagina.  You think you are pregnant, or you miss your menstrual period.  The IUD string is hurting your sex partner. Document Released: 10/02/2010 Document Revised: 04/28/2011 Document Reviewed: 07/25/2012 [employer] Patient Information 2014 [employer]  

## 2012-12-29 NOTE — Progress Notes (Signed)
  Subjective:    Patient ID: Patricia Blackwell, female    DOB: February 07, 1991, 22 y.o.   MRN: [id number]  HPI  22 yo SW lady who is here for her 6 week pp visit. 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 She has not had sex yet and wants a Mirena for contraception. She reports normal bowel and bladder function. She denies depression and scored 1 on the test.  Review of Systems  Her pap smear is due. 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 She had a flu vaccine during pregnancy.    Objective:   Physical Exam  UPT negative, consent signed, Time out procedure done. Cervix prepped with betadine and grasped with a single tooth tenaculum. Mirena was easily placed and the strings were cut to 3-4 cm. Uterus sounded to 9 cm. She tolerated the procedure well.       Assessment & Plan:   Postpartum- doing well Contraception- Mirena. RTC 4 weeks for string check Preventative care- pap smear done today

## 2013-01-26 ENCOUNTER — Ambulatory Visit: Payer: Medicaid Other | Admitting: Obstetrics & Gynecology

## 2013-10-27 ENCOUNTER — Ambulatory Visit (INDEPENDENT_AMBULATORY_CARE_PROVIDER_SITE_OTHER): Payer: Medicaid Other | Admitting: Obstetrics & Gynecology

## 2013-10-27 ENCOUNTER — Encounter: Payer: Self-pay | Admitting: Obstetrics & Gynecology

## 2013-10-27 ENCOUNTER — Other Ambulatory Visit: Payer: Self-pay | Admitting: Obstetrics & Gynecology

## 2013-10-27 VITALS — BP 123/83 | HR 74 | Resp 16 | Ht 64.0 in | Wt 139.0 lb

## 2013-10-27 DIAGNOSIS — N949 Unspecified condition associated with female genital organs and menstrual cycle: Secondary | ICD-10-CM

## 2013-10-27 DIAGNOSIS — N9489 Other specified conditions associated with female genital organs and menstrual cycle: Secondary | ICD-10-CM

## 2013-10-27 MED ORDER — IBUPROFEN 600 MG PO TABS: 600.0000 mg | ORAL_TABLET | Freq: Four times a day (QID) | ORAL | Status: DC | PRN

## 2013-10-27 NOTE — Progress Notes (Signed)
   Subjective:    Patient ID: Patricia Blackwell, female    DOB: 1991/01/20, 23 y.o.   MRN: [id number]  Gynecologic Exam The patient's primary symptoms include pelvic pain. Pertinent negatives include no constipation, diarrhea, flank pain, frequency, nausea or vomiting.    Pt had cramps recently which is new for her  She has an IUD and has been reasonably happy with.  Pt has not had sex since delivery.  Pt denies urinary and GI complaints.  Pt not having irregular bleeding.   Review of Systems  Constitutional: Negative.   Respiratory: Negative.   Cardiovascular: Negative.   Gastrointestinal: Negative for nausea, vomiting, diarrhea and constipation.  Genitourinary: Positive for pelvic pain. Negative for frequency, flank pain, vaginal pain and dyspareunia.  Psychiatric/Behavioral: Negative.    See HPI    Objective:   Physical Exam  Vitals reviewed. Constitutional: She is oriented to person, place, and time. She appears well-developed and well-nourished. No distress.  HENT:  Head: Normocephalic and atraumatic.  Eyes: Conjunctivae are normal.  Pulmonary/Chest: Effort normal.  Abdominal: She exhibits no distension and no mass. There is no tenderness. There is no rebound and no guarding.  Genitourinary: Vagina normal.  Cervix nml with strings 3 cm Uterus retroverted, non tender Mild right sided pain, no mass No left sided pain  Musculoskeletal: She exhibits no edema.  Neurological: She is alert and oriented to person, place, and time.  Skin: Skin is warm and dry.  Psychiatric: She has a normal mood and affect.          Assessment & Plan:  23 yo female with pelvic pain and IUD  1-TVUS 2-Will call patient with results if nml 3-Iuprofen 600 mg q 6 hours

## 2013-11-04 ENCOUNTER — Other Ambulatory Visit: Payer: Self-pay | Admitting: Obstetrics & Gynecology

## 2013-11-04 ENCOUNTER — Ambulatory Visit (INDEPENDENT_AMBULATORY_CARE_PROVIDER_SITE_OTHER): Payer: Medicaid Other

## 2013-11-04 ENCOUNTER — Ambulatory Visit (HOSPITAL_COMMUNITY): Admission: RE | Admit: 2013-11-04 | Payer: Medicaid Other | Source: Ambulatory Visit

## 2013-11-04 DIAGNOSIS — N9489 Other specified conditions associated with female genital organs and menstrual cycle: Secondary | ICD-10-CM

## 2013-11-04 DIAGNOSIS — Z975 Presence of (intrauterine) contraceptive device: Secondary | ICD-10-CM

## 2013-11-04 DIAGNOSIS — N854 Malposition of uterus: Secondary | ICD-10-CM

## 2013-11-07 ENCOUNTER — Telehealth: Payer: Self-pay | Admitting: *Deleted

## 2013-11-07 NOTE — Telephone Encounter (Signed)
Message copied by Granville Lewis on Mon Nov 07, 2013 12:05 PM ------      Message from: Lesly Dukes      Created: Mon Nov 07, 2013 10:24 AM       Pt has nml [place] with IUD in nml position.  RN to call with results. ------

## 2013-11-07 NOTE — Telephone Encounter (Signed)
LM on cell voicemail of normal u/s and IUD is in place.

## 2013-11-17 ENCOUNTER — Ambulatory Visit (INDEPENDENT_AMBULATORY_CARE_PROVIDER_SITE_OTHER): Payer: Medicaid Other | Admitting: Obstetrics & Gynecology

## 2013-11-17 ENCOUNTER — Encounter: Payer: Self-pay | Admitting: Obstetrics & Gynecology

## 2013-11-17 VITALS — BP 110/71 | HR 63 | Resp 16 | Ht 64.0 in | Wt 141.0 lb

## 2013-11-17 DIAGNOSIS — Z30431 Encounter for routine checking of intrauterine contraceptive device: Secondary | ICD-10-CM

## 2013-11-17 NOTE — Progress Notes (Signed)
Filed Vitals:   11/17/13 1404  BP: 110/71  Pulse: 63  Resp: 16  Height: 5\' 4"  (1.626 m)  Weight: 141 lb (63.957 kg)    Pt thought she wanted to remove IUD.  We gave her literature on the Nexplanon but has now decided to keep Mirena.    Cramping has subsided with return of menses this month.   Pap up to date.

## 2013-12-19 ENCOUNTER — Encounter: Payer: Self-pay | Admitting: Obstetrics & Gynecology

## 2015-02-06 ENCOUNTER — Other Ambulatory Visit (HOSPITAL_COMMUNITY)
Admission: RE | Admit: 2015-02-06 | Discharge: 2015-02-06 | Disposition: A | Discharge status: Home / Self Care | Payer: Medicaid Other | Source: Ambulatory Visit | Attending: Obstetrics & Gynecology | Admitting: Obstetrics & Gynecology

## 2015-02-06 ENCOUNTER — Ambulatory Visit (INDEPENDENT_AMBULATORY_CARE_PROVIDER_SITE_OTHER): Payer: Medicaid Other | Admitting: Obstetrics & Gynecology

## 2015-02-06 ENCOUNTER — Encounter: Payer: Self-pay | Admitting: Obstetrics & Gynecology

## 2015-02-06 VITALS — BP 126/74 | HR 80 | Resp 16 | Ht 64.0 in | Wt 131.0 lb

## 2015-02-06 DIAGNOSIS — Z0000 Encounter for general adult medical examination without abnormal findings: Secondary | ICD-10-CM

## 2015-02-06 DIAGNOSIS — R399 Unspecified symptoms and signs involving the genitourinary system: Secondary | ICD-10-CM | POA: Diagnosis not present

## 2015-02-06 DIAGNOSIS — Z01419 Encounter for gynecological examination (general) (routine) without abnormal findings: Secondary | ICD-10-CM

## 2015-02-06 DIAGNOSIS — Z113 Encounter for screening for infections with a predominantly sexual mode of transmission: Secondary | ICD-10-CM | POA: Insufficient documentation

## 2015-02-06 DIAGNOSIS — Z30431 Encounter for routine checking of intrauterine contraceptive device: Secondary | ICD-10-CM

## 2015-02-06 DIAGNOSIS — Z124 Encounter for screening for malignant neoplasm of cervix: Secondary | ICD-10-CM

## 2015-02-06 DIAGNOSIS — N760 Acute vaginitis: Secondary | ICD-10-CM | POA: Diagnosis present

## 2015-02-06 LAB — POCT URINALYSIS DIPSTICK
Bilirubin, UA: NEGATIVE
Glucose, UA: NEGATIVE
Ketones, UA: NEGATIVE
LEUKOCYTES UA: NEGATIVE
NITRITE UA: NEGATIVE
PROTEIN UA: NEGATIVE
Spec Grav, UA: 1.02
Urobilinogen, UA: NEGATIVE
pH, UA: 5

## 2015-02-06 MED ORDER — SULFAMETHOXAZOLE-TRIMETHOPRIM 800-160 MG PO TABS: 1.0000 | ORAL_TABLET | Freq: Two times a day (BID) | ORAL | Status: AC

## 2015-02-06 MED ORDER — CIPROFLOXACIN HCL 500 MG PO TABS: 500.0000 mg | ORAL_TABLET | Freq: Two times a day (BID) | ORAL | Status: DC

## 2015-02-06 NOTE — Patient Instructions (Signed)
Thank you for enrolling in [facility]. Please follow the instructions below to securely access your online medical record. MyChart allows you to send messages to your doctor, view your test results, manage appointments, and more.   How Do I Sign Up? 1. In your Internet browser, go to [employer] and enter [email or web address]. 2. Click on the Sign Up Now link in the Sign In box. You will see the New Member Sign Up page. 3. Enter your MyChart Access Code exactly as it appears below. You will not need to use this code after you've completed the sign-up process. If you do not sign up before the expiration date, you must request a new code.  MyChart Access Code: [email or web address] Expires: 04/07/2015  1:06 PM  4. Enter your Social Security Number ([email or web address]) and Date of Birth (mm/dd/yyyy) as indicated and click Submit. You will be taken to the next sign-up page. 5. Create a MyChart ID. This will be your MyChart login ID and cannot be changed, so think of one that is secure and easy to remember. 6. Create a MyChart password. You can change your password at any time. 7. Enter your Password Reset Question and Answer. This can be used at a later time if you forget your password.  8. Enter your e-mail address. You will receive e-mail notification when new information is available in [facility]. 9. Click Sign Up. You can now view your medical record.   Additional Information Remember, MyChart is NOT to be used for urgent needs. For medical emergencies, dial 911.  Preventive Care for Adults, Female A healthy lifestyle and preventive care can promote health and wellness. Preventive health guidelines for women include the following key practices.  A routine yearly physical is a good way to check with your health care provider about your health and preventive screening. It is a chance to share any concerns and updates on your health and to receive a thorough exam.  Visit your dentist for a routine  exam and preventive care every 6 months. Brush your teeth twice a day and floss once a day. Good oral hygiene prevents tooth decay and gum disease.  The frequency of eye exams is based on your age, health, family medical history, use of contact lenses, and other factors. Follow your health care provider's recommendations for frequency of eye exams.  Eat a healthy diet. Foods like vegetables, fruits, whole grains, low-fat dairy products, and lean protein foods contain the nutrients you need without too many calories. Decrease your intake of foods high in solid fats, added sugars, and salt. Eat the right amount of calories for you.Get information about a proper diet from your health care provider, if necessary.  Regular physical exercise is one of the most important things you can do for your health. Most adults should get at least 150 minutes of moderate-intensity exercise (any activity that increases your heart rate and causes you to sweat) each week. In addition, most adults need muscle-strengthening exercises on 2 or more days a week.  Maintain a healthy weight. The body mass index (BMI) is a screening tool to identify possible weight problems. It provides an estimate of body fat based on height and weight. Your health care provider can find your BMI and can help you achieve or maintain a healthy weight.For adults 20 years and older:  A BMI below 18.5 is considered underweight.  A BMI of 18.5 to 24.9 is normal.  A BMI of 25 to 29.9 is considered overweight.  A BMI  of 30 and above is considered obese.  Maintain normal blood lipids and cholesterol levels by exercising and minimizing your intake of saturated fat. Eat a balanced diet with plenty of fruit and vegetables. Blood tests for lipids and cholesterol should begin at age 12 and be repeated every 5 years. If your lipid or cholesterol levels are high, you are over 50, or you are at high risk for heart disease, you may need your cholesterol  levels checked more frequently.Ongoing high lipid and cholesterol levels should be treated with medicines if diet and exercise are not working.  If you smoke, find out from your health care provider how to quit. If you do not use tobacco, do not start.  Lung cancer screening is recommended for adults aged 63-80 years who are at high risk for developing lung cancer because of a history of smoking. A yearly low-dose CT scan of the lungs is recommended for people who have at least a 30-pack-year history of smoking and are a current smoker or have quit within the past 15 years. A pack year of smoking is smoking an average of 1 pack of cigarettes a day for 1 year (for example: 1 pack a day for 30 years or 2 packs a day for 15 years). Yearly screening should continue until the smoker has stopped smoking for at least 15 years. Yearly screening should be stopped for people who develop a health problem that would prevent them from having lung cancer treatment.  If you are pregnant, do not drink alcohol. If you are breastfeeding, be very cautious about drinking alcohol. If you are not pregnant and choose to drink alcohol, do not have more than 1 drink per day. One drink is considered to be 12 ounces (355 mL) of beer, 5 ounces (148 mL) of wine, or 1.5 ounces (44 mL) of liquor.  Avoid use of street drugs. Do not share needles with anyone. Ask for help if you need support or instructions about stopping the use of drugs.  High blood pressure causes heart disease and increases the risk of stroke. Your blood pressure should be checked at least every 1 to 2 years. Ongoing high blood pressure should be treated with medicines if weight loss and exercise do not work.  If you are 63-62 years old, ask your health care provider if you should take aspirin to prevent strokes.  Diabetes screening is done by taking a blood sample to check your blood glucose level after you have not eaten for a certain period of time (fasting).  If you are not overweight and you do not have risk factors for diabetes, you should be screened once every 3 years starting at age 55. If you are overweight or obese and you are 61-33 years of age, you should be screened for diabetes every year as part of your cardiovascular risk assessment.  Breast cancer screening is essential preventive care for women. You should practice "breast self-awareness." This means understanding the normal appearance and feel of your breasts and may include breast self-examination. Any changes detected, no matter how small, should be reported to a health care provider. Women in their 62s and 30s should have a clinical breast exam (CBE) by a health care provider as part of a regular health exam every 1 to 3 years. After age 33, women should have a CBE every year. Starting at age 88, women should consider having a mammogram (breast X-ray test) every year. Women who have a family history of breast cancer  should talk to their health care provider about genetic screening. Women at a high risk of breast cancer should talk to their health care providers about having an MRI and a mammogram every year.  Breast cancer gene (BRCA)-related cancer risk assessment is recommended for women who have family members with BRCA-related cancers. BRCA-related cancers include breast, ovarian, tubal, and peritoneal cancers. Having family members with these cancers may be associated with an increased risk for harmful changes (mutations) in the breast cancer genes BRCA1 and BRCA2. Results of the assessment will determine the need for genetic counseling and BRCA1 and BRCA2 testing.  Your health care provider may recommend that you be screened regularly for cancer of the pelvic organs (ovaries, uterus, and vagina). This screening involves a pelvic examination, including checking for microscopic changes to the surface of your cervix (Pap test). You may be encouraged to have this screening done every 3 years,  beginning at age 59.  For women ages 71-65, health care providers may recommend pelvic exams and Pap testing every 3 years, or they may recommend the Pap and pelvic exam, combined with testing for human papilloma virus (HPV), every 5 years. Some types of HPV increase your risk of cervical cancer. Testing for HPV may also be done on women of any age with unclear Pap test results.  Other health care providers may not recommend any screening for nonpregnant women who are considered low risk for pelvic cancer and who do not have symptoms. Ask your health care provider if a screening pelvic exam is right for you.  If you have had past treatment for cervical cancer or a condition that could lead to cancer, you need Pap tests and screening for cancer for at least 20 years after your treatment. If Pap tests have been discontinued, your risk factors (such as having a new sexual partner) need to be reassessed to determine if screening should resume. Some women have medical problems that increase the chance of getting cervical cancer. In these cases, your health care provider may recommend more frequent screening and Pap tests.  Colorectal cancer can be detected and often prevented. Most routine colorectal cancer screening begins at the age of 70 years and continues through age 28 years. However, your health care provider may recommend screening at an earlier age if you have risk factors for colon cancer. On a yearly basis, your health care provider may provide home test kits to check for hidden blood in the stool. Use of a small camera at the end of a tube, to directly examine the colon (sigmoidoscopy or colonoscopy), can detect the earliest forms of colorectal cancer. Talk to your health care provider about this at age 31, when routine screening begins. Direct exam of the colon should be repeated every 5-10 years through age 5 years, unless early forms of precancerous polyps or small growths are found.  People  who are at an increased risk for hepatitis B should be screened for this virus. You are considered at high risk for hepatitis B if:  You were born in a country where hepatitis B occurs often. Talk with your health care provider about which countries are considered high risk.  Your parents were born in a high-risk country and you have not received a shot to protect against hepatitis B (hepatitis B vaccine).  You have HIV or AIDS.  You use needles to inject street drugs.  You live with, or have sex with, someone who has hepatitis B.  You get hemodialysis treatment.  You take certain medicines for conditions like cancer, organ transplantation, and autoimmune conditions.  Hepatitis C blood testing is recommended for all people born from 1945 through 1965 and any individual with known risks for hepatitis C.  Practice safe sex. Use condoms and avoid high-risk sexual practices to reduce the spread of sexually transmitted infections (STIs). STIs include gonorrhea, chlamydia, syphilis, trichomonas, herpes, HPV, and human immunodeficiency virus (HIV). Herpes, HIV, and HPV are viral illnesses that have no cure. They can result in disability, cancer, and death.  You should be screened for sexually transmitted illnesses (STIs) including gonorrhea and chlamydia if:  You are sexually active and are younger than 24 years.  You are older than 24 years and your health care provider tells you that you are at risk for this type of infection.  Your sexual activity has changed since you were last screened and you are at an increased risk for chlamydia or gonorrhea. Ask your health care provider if you are at risk.  If you are at risk of being infected with HIV, it is recommended that you take a prescription medicine daily to prevent HIV infection. This is called preexposure prophylaxis (PrEP). You are considered at risk if:  You are sexually active and do not regularly use condoms or know the HIV status of  your partner(s).  You take drugs by injection.  You are sexually active with a partner who has HIV.  Talk with your health care provider about whether you are at high risk of being infected with HIV. If you choose to begin PrEP, you should first be tested for HIV. You should then be tested every 3 months for as long as you are taking PrEP.  Osteoporosis is a disease in which the bones lose minerals and strength with aging. This can result in serious bone fractures or breaks. The risk of osteoporosis can be identified using a bone density scan. Women ages 65 years and over and women at risk for fractures or osteoporosis should discuss screening with their health care providers. Ask your health care provider whether you should take a calcium supplement or vitamin D to reduce the rate of osteoporosis.  Menopause can be associated with physical symptoms and risks. Hormone replacement therapy is available to decrease symptoms and risks. You should talk to your health care provider about whether hormone replacement therapy is right for you.  Use sunscreen. Apply sunscreen liberally and repeatedly throughout the day. You should seek shade when your shadow is shorter than you. Protect yourself by wearing long sleeves, pants, a wide-brimmed hat, and sunglasses year round, whenever you are outdoors.  Once a month, do a whole body skin exam, using a mirror to look at the skin on your back. Tell your health care provider of new moles, moles that have irregular borders, moles that are larger than a pencil eraser, or moles that have changed in shape or color.  Stay current with required vaccines (immunizations).  Influenza vaccine. All adults should be immunized every year.  Tetanus, diphtheria, and acellular pertussis (Td, Tdap) vaccine. Pregnant women should receive 1 dose of Tdap vaccine during each pregnancy. The dose should be obtained regardless of the length of time since the last dose. Immunization is  preferred during the 27th-36th week of gestation. An adult who has not previously received Tdap or who does not know her vaccine status should receive 1 dose of Tdap. This initial dose should be followed by tetanus and diphtheria toxoids (Td) booster doses every   10 years. Adults with an unknown or incomplete history of completing a 3-dose immunization series with Td-containing vaccines should begin or complete a primary immunization series including a Tdap dose. Adults should receive a Td booster every 10 years.  Varicella vaccine. An adult without evidence of immunity to varicella should receive 2 doses or a second dose if she has previously received 1 dose. Pregnant females who do not have evidence of immunity should receive the first dose after pregnancy. This first dose should be obtained before leaving the health care facility. The second dose should be obtained 4-8 weeks after the first dose.  Human papillomavirus (HPV) vaccine. Females aged 13-26 years who have not received the vaccine previously should obtain the 3-dose series. The vaccine is not recommended for use in pregnant females. However, pregnancy testing is not needed before receiving a dose. If a female is found to be pregnant after receiving a dose, no treatment is needed. In that case, the remaining doses should be delayed until after the pregnancy. Immunization is recommended for any person with an immunocompromised condition through the age of 26 years if she did not get any or all doses earlier. During the 3-dose series, the second dose should be obtained 4-8 weeks after the first dose. The third dose should be obtained 24 weeks after the first dose and 16 weeks after the second dose.  Zoster vaccine. One dose is recommended for adults aged 60 years or older unless certain conditions are present.  Measles, mumps, and rubella (MMR) vaccine. Adults born before 1957 generally are considered immune to measles and mumps. Adults born in 1957  or later should have 1 or more doses of MMR vaccine unless there is a contraindication to the vaccine or there is laboratory evidence of immunity to each of the three diseases. A routine second dose of MMR vaccine should be obtained at least 28 days after the first dose for students attending postsecondary schools, health care workers, or international travelers. People who received inactivated measles vaccine or an unknown type of measles vaccine during 1963-1967 should receive 2 doses of MMR vaccine. People who received inactivated mumps vaccine or an unknown type of mumps vaccine before 1979 and are at high risk for mumps infection should consider immunization with 2 doses of MMR vaccine. For females of childbearing age, rubella immunity should be determined. If there is no evidence of immunity, females who are not pregnant should be vaccinated. If there is no evidence of immunity, females who are pregnant should delay immunization until after pregnancy. Unvaccinated health care workers born before 1957 who lack laboratory evidence of measles, mumps, or rubella immunity or laboratory confirmation of disease should consider measles and mumps immunization with 2 doses of MMR vaccine or rubella immunization with 1 dose of MMR vaccine.  Pneumococcal 13-valent conjugate (PCV13) vaccine. When indicated, a person who is uncertain of his immunization history and has no record of immunization should receive the PCV13 vaccine. All adults 65 years of age and older should receive this vaccine. An adult aged 19 years or older who has certain medical conditions and has not been previously immunized should receive 1 dose of PCV13 vaccine. This PCV13 should be followed with a dose of pneumococcal polysaccharide (PPSV23) vaccine. Adults who are at high risk for pneumococcal disease should obtain the PPSV23 vaccine at least 8 weeks after the dose of PCV13 vaccine. Adults older than 25 years of age who have normal immune system  function should obtain the PPSV23 vaccine   dose at least 1 year after the dose of PCV13 vaccine.  Pneumococcal polysaccharide (PPSV23) vaccine. When PCV13 is also indicated, PCV13 should be obtained first. All adults aged 65 years and older should be immunized. An adult younger than age 65 years who has certain medical conditions should be immunized. Any person who resides in a nursing home or long-term care facility should be immunized. An adult smoker should be immunized. People with an immunocompromised condition and certain other conditions should receive both PCV13 and PPSV23 vaccines. People with human immunodeficiency virus (HIV) infection should be immunized as soon as possible after diagnosis. Immunization during chemotherapy or radiation therapy should be avoided. Routine use of PPSV23 vaccine is not recommended for American Indians, [place] Natives, or people younger than 65 years unless there are medical conditions that require PPSV23 vaccine. When indicated, people who have unknown immunization and have no record of immunization should receive PPSV23 vaccine. One-time revaccination 5 years after the first dose of PPSV23 is recommended for people aged 19-64 years who have chronic kidney failure, nephrotic syndrome, asplenia, or immunocompromised conditions. People who received 1-2 doses of PPSV23 before age 65 years should receive another dose of PPSV23 vaccine at age 65 years or later if at least 5 years have passed since the previous dose. Doses of PPSV23 are not needed for people immunized with PPSV23 at or after age 65 years.  Meningococcal vaccine. Adults with asplenia or persistent complement component deficiencies should receive 2 doses of quadrivalent meningococcal conjugate (MenACWY-D) vaccine. The doses should be obtained at least 2 months apart. Microbiologists working with certain meningococcal bacteria, military recruits, people at risk during an outbreak, and people who travel to or live  in countries with a high rate of meningitis should be immunized. A first-year college student up through age 21 years who is living in a residence hall should receive a dose if she did not receive a dose on or after her 16th birthday. Adults who have certain high-risk conditions should receive one or more doses of vaccine.  Hepatitis A vaccine. Adults who wish to be protected from this disease, have certain high-risk conditions, work with hepatitis A-infected animals, work in hepatitis A research labs, or travel to or work in countries with a high rate of hepatitis A should be immunized. Adults who were previously unvaccinated and who anticipate close contact with an international adoptee during the first 60 days after arrival in the [place] from a country with a high rate of hepatitis A should be immunized.  Hepatitis B vaccine. Adults who wish to be protected from this disease, have certain high-risk conditions, may be exposed to blood or other infectious body fluids, are household contacts or sex partners of hepatitis B positive people, are clients or workers in certain care facilities, or travel to or work in countries with a high rate of hepatitis B should be immunized.  Haemophilus influenzae type b (Hib) vaccine. A previously unvaccinated person with asplenia or sickle cell disease or having a scheduled splenectomy should receive 1 dose of Hib vaccine. Regardless of previous immunization, a recipient of a hematopoietic stem cell transplant should receive a 3-dose series 6-12 months after her successful transplant. Hib vaccine is not recommended for adults with HIV infection. Preventive Services / Frequency Ages 19 to 39 years  Blood pressure check.** / Every 3-5 years.  Lipid and cholesterol check.** / Every 5 years beginning at age 20.  Clinical breast exam.** / Every 3 years for women in their 20s   and 30s.  BRCA-related cancer risk assessment.** / For women who have family members with  a BRCA-related cancer (breast, ovarian, tubal, or peritoneal cancers).  Pap test.** / Every 2 years from ages 21 through 29. Every 3 years starting at age 30 through age 65 or 70 with a history of 3 consecutive normal Pap tests.  HPV screening.** / Every 3 years from ages 30 through ages 65 to 70 with a history of 3 consecutive normal Pap tests.  Hepatitis C blood test.** / For any individual with known risks for hepatitis C.  Skin self-exam. / Monthly.  Influenza vaccine. / Every year.  Tetanus, diphtheria, and acellular pertussis (Tdap, Td) vaccine.** / Consult your health care provider. Pregnant women should receive 1 dose of Tdap vaccine during each pregnancy. 1 dose of Td every 10 years.  Varicella vaccine.** / Consult your health care provider. Pregnant females who do not have evidence of immunity should receive the first dose after pregnancy.  HPV vaccine. / 3 doses over 6 months, if 26 and younger. The vaccine is not recommended for use in pregnant females. However, pregnancy testing is not needed before receiving a dose.  Measles, mumps, rubella (MMR) vaccine.** / You need at least 1 dose of MMR if you were born in 1957 or later. You may also need a 2nd dose. For females of childbearing age, rubella immunity should be determined. If there is no evidence of immunity, females who are not pregnant should be vaccinated. If there is no evidence of immunity, females who are pregnant should delay immunization until after pregnancy.  Pneumococcal 13-valent conjugate (PCV13) vaccine.** / Consult your health care provider.  Pneumococcal polysaccharide (PPSV23) vaccine.** / 1 to 2 doses if you smoke cigarettes or if you have certain conditions.  Meningococcal vaccine.** / 1 dose if you are age 19 to 21 years and a first-year college student living in a residence hall, or have one of several medical conditions, you need to get vaccinated against meningococcal disease. You may also need  additional booster doses.  Hepatitis A vaccine.** / Consult your health care provider.  Hepatitis B vaccine.** / Consult your health care provider.  Haemophilus influenzae type b (Hib) vaccine.** / Consult your health care provider. Ages 40 to 64 years  Blood pressure check.** / Every year.  Lipid and cholesterol check.** / Every 5 years beginning at age 20 years.  Lung cancer screening. / Every year if you are aged 55-80 years and have a 30-pack-year history of smoking and currently smoke or have quit within the past 15 years. Yearly screening is stopped once you have quit smoking for at least 15 years or develop a health problem that would prevent you from having lung cancer treatment.  Clinical breast exam.** / Every year after age 40 years.  BRCA-related cancer risk assessment.** / For women who have family members with a BRCA-related cancer (breast, ovarian, tubal, or peritoneal cancers).  Mammogram.** / Every year beginning at age 40 years and continuing for as long as you are in good health. Consult with your health care provider.  Pap test.** / Every 3 years starting at age 30 years through age 65 or 70 years with a history of 3 consecutive normal Pap tests.  HPV screening.** / Every 3 years from ages 30 years through ages 65 to 70 years with a history of 3 consecutive normal Pap tests.  Fecal occult blood test (FOBT) of stool. / Every year beginning at age 50 years and continuing   until age 75 years. You may not need to do this test if you get a colonoscopy every 10 years.  Flexible sigmoidoscopy or colonoscopy.** / Every 5 years for a flexible sigmoidoscopy or every 10 years for a colonoscopy beginning at age 50 years and continuing until age 75 years.  Hepatitis C blood test.** / For all people born from 1945 through 1965 and any individual with known risks for hepatitis C.  Skin self-exam. / Monthly.  Influenza vaccine. / Every year.  Tetanus, diphtheria, and acellular  pertussis (Tdap/Td) vaccine.** / Consult your health care provider. Pregnant women should receive 1 dose of Tdap vaccine during each pregnancy. 1 dose of Td every 10 years.  Varicella vaccine.** / Consult your health care provider. Pregnant females who do not have evidence of immunity should receive the first dose after pregnancy.  Zoster vaccine.** / 1 dose for adults aged 60 years or older.  Measles, mumps, rubella (MMR) vaccine.** / You need at least 1 dose of MMR if you were born in 1957 or later. You may also need a second dose. For females of childbearing age, rubella immunity should be determined. If there is no evidence of immunity, females who are not pregnant should be vaccinated. If there is no evidence of immunity, females who are pregnant should delay immunization until after pregnancy.  Pneumococcal 13-valent conjugate (PCV13) vaccine.** / Consult your health care provider.  Pneumococcal polysaccharide (PPSV23) vaccine.** / 1 to 2 doses if you smoke cigarettes or if you have certain conditions.  Meningococcal vaccine.** / Consult your health care provider.  Hepatitis A vaccine.** / Consult your health care provider.  Hepatitis B vaccine.** / Consult your health care provider.  Haemophilus influenzae type b (Hib) vaccine.** / Consult your health care provider. Ages 65 years and over  Blood pressure check.** / Every year.  Lipid and cholesterol check.** / Every 5 years beginning at age 20 years.  Lung cancer screening. / Every year if you are aged 55-80 years and have a 30-pack-year history of smoking and currently smoke or have quit within the past 15 years. Yearly screening is stopped once you have quit smoking for at least 15 years or develop a health problem that would prevent you from having lung cancer treatment.  Clinical breast exam.** / Every year after age 40 years.  BRCA-related cancer risk assessment.** / For women who have family members with a BRCA-related  cancer (breast, ovarian, tubal, or peritoneal cancers).  Mammogram.** / Every year beginning at age 40 years and continuing for as long as you are in good health. Consult with your health care provider.  Pap test.** / Every 3 years starting at age 30 years through age 65 or 70 years with 3 consecutive normal Pap tests. Testing can be stopped between 65 and 70 years with 3 consecutive normal Pap tests and no abnormal Pap or HPV tests in the past 10 years.  HPV screening.** / Every 3 years from ages 30 years through ages 65 or 70 years with a history of 3 consecutive normal Pap tests. Testing can be stopped between 65 and 70 years with 3 consecutive normal Pap tests and no abnormal Pap or HPV tests in the past 10 years.  Fecal occult blood test (FOBT) of stool. / Every year beginning at age 50 years and continuing until age 75 years. You may not need to do this test if you get a colonoscopy every 10 years.  Flexible sigmoidoscopy or colonoscopy.** / Every 5 years   for a flexible sigmoidoscopy or every 10 years for a colonoscopy beginning at age 61 years and continuing until age 94 years.  Hepatitis C blood test.** / For all people born from [birth year] through 1966 and any individual with known risks for hepatitis C.  Osteoporosis screening.** / A one-time screening for women ages 22 years and over and women at risk for fractures or osteoporosis.  Skin self-exam. / Monthly.  Influenza vaccine. / Every year.  Tetanus, diphtheria, and acellular pertussis (Tdap/Td) vaccine.** / 1 dose of Td every 10 years.  Varicella vaccine.** / Consult your health care provider.  Zoster vaccine.** / 1 dose for adults aged 66 years or older.  Pneumococcal 13-valent conjugate (PCV13) vaccine.** / Consult your health care provider.  Pneumococcal polysaccharide (PPSV23) vaccine.** / 1 dose for all adults aged 14 years and older.  Meningococcal vaccine.** / Consult your health care provider.  Hepatitis A vaccine.** /  Consult your health care provider.  Hepatitis B vaccine.** / Consult your health care provider.  Haemophilus influenzae type b (Hib) vaccine.** / Consult your health care provider. ** Family history and personal history of risk and conditions may change your health care provider's recommendations.   This information is not intended to replace advice given to you by your health care provider. Make sure you discuss any questions you have with your health care provider.   Document Released: 04/01/2001 Document Revised: 02/24/2014 Document Reviewed: 07/01/2010 Elsevier Interactive Patient Education [employer].

## 2015-02-06 NOTE — Progress Notes (Signed)
GYNECOLOGY CLINIC ANNUAL PREVENTATIVE CARE ENCOUNTER NOTE  Subjective:   Patricia Blackwell is a 24 y.o. [phone] female here for a routine annual gynecologic exam.  Current complaints: increased urinary frequency, blood in urine and some dysuria.  Denies any fevers, back pain, nausea and vomiting.  Denies abnormal vaginal bleeding, discharge, other pelvic pain, problems with intercourse or other gynecologic concerns.    Gynecologic History Patient's last menstrual period was 01/27/2015. Contraception: Mirena IUD placed 12/29/2012.  No concerning symptoms. Last Pap: 12/29/2012. Results were: normal  Obstetric History OB History  Gravida Para Term Preterm AB SAB TAB Ectopic Multiple Living  3 1 1  0 2 1 1   1     # Outcome Date GA Lbr Len/2nd Weight Sex Delivery Anes PTL Lv  3 Term 11/18/12 [id number] 08:14 / 00:49 6 lb 15 oz (3.147 kg) M Vag-Spont EPI  Y  2 SAB           1 TAB               Past Medical History  Diagnosis Date  . Narcotic abuse     currently taking methadone  . Urinary tract infection     Past Surgical History  Procedure Laterality Date  . Induced abortion    . Wisdom tooth extraction      Current Outpatient Prescriptions on File Prior to Visit  Medication Sig Dispense Refill  . ibuprofen (ADVIL,MOTRIN) 600 MG tablet Take 1 tablet (600 mg total) by mouth every 6 (six) hours. 50 tablet 1  . levonorgestrel (MIRENA) 20 MCG/24HR IUD 1 each by Intrauterine route once.    . Multiple Vitamin (MULTIVITAMIN WITH MINERALS) TABS tablet Take 1 tablet by mouth daily.    . methadone (DOLOPHINE) 0.4 mg/mL SOLN Take 15 mg by mouth daily. [facility]     No current facility-administered medications on file prior to visit.    No Known Allergies  Social History   Social History  . Marital Status: Single    Spouse Name: N/A  . Number of Children: N/A  . Years of Education: N/A   Occupational History  .  homemaker    Social History Main Topics  . Smoking status: Former Smoker -- 0.50 packs/day    Types: Cigarettes  . Smokeless tobacco: Former NeurosurgeonUser    Quit date: 04/03/2012  . Alcohol Use: No  . Drug Use: Yes    Special: IV, Opium     Comment: on methadone  . Sexual Activity:    Partners: Male    CopyBirth Control/ Protection: None, IUD     Comment: Heroin   Other Topics Concern  . Not on file   Social History Narrative    Family History  Problem Relation Age of Onset  . Heart disease Father     valve replaced  . Cancer Maternal Grandmother     lung  . Multiple sclerosis Paternal Grandmother     The following portions of the patient's history were reviewed and updated as appropriate: allergies, current medications, past family history, past medical history, past social history, past surgical history and problem list.  Review of Systems Pertinent items noted in HPI and remainder of comprehensive ROS otherwise negative.   Objective:  BP 126/74 mmHg  Pulse 80  Resp 16  Ht 5\' 4"  (1.626 m)  Wt 131 lb (59.421 kg)  BMI 22.47 kg/m2  LMP 01/27/2015 CONSTITUTIONAL: Well-developed, well-nourished female in no acute distress.  HENT:  Normocephalic, atraumatic, External right and left ear normal. Oropharynx is clear and moist EYES: Conjunctivae and EOM are normal. Pupils are equal, round, and reactive to light. No scleral icterus.  NECK: Normal range of motion, supple, no masses.  Normal thyroid.  SKIN: Skin is warm and dry. No rash noted. Not diaphoretic. No erythema. No pallor. NEUROLGIC: Alert and oriented to person, place, and time. Normal reflexes, muscle tone coordination. No cranial nerve deficit noted. PSYCHIATRIC: Normal mood and affect. Normal behavior. Normal judgment and thought content. CARDIOVASCULAR: Normal heart rate noted, regular rhythm RESPIRATORY: Clear to auscultation bilaterally. Effort and breath sounds normal, no problems with respiration noted. BREASTS:  Symmetric in size. No masses, skin changes, nipple drainage, or lymphadenopathy. ABDOMEN: Soft, normal bowel sounds, no distention noted.  No tenderness, rebound or guarding.  PELVIC: Normal appearing external genitalia; normal appearing vaginal mucosa and cervix.  Mirena IUD strings seen. No abnormal discharge noted.  Pap smear obtained, small amount of bleeding noted.  Normal uterine size, no other palpable masses, no uterine or adnexal tenderness. MUSCULOSKELETAL: Normal range of motion. No tenderness.  No cyanosis, clubbing, or edema.  2+ distal pulses.  Urinalysis: Large blood in urine  Assessment:  Annual gynecologic examination with pap smear UTI symptoms IUD surveillance   Plan:  Will follow up results of pap smear and manage accordingly. Urine culture sent, presumptive treatment with Bactrim DS (increased risk of QT prolongation or arrhythmias with fluoroquinolones) given.  Will follow up results and manage accordingly. IUD in place, no concerning symptoms. Will continue until 12/29/2017 or remove earlier if needed. Routine preventative health maintenance measures emphasized. Please refer to After Visit Summary for other counseling recommendations.    Jaynie Collins, MD, FACOG Attending Obstetrician & Gynecologist, [facility]

## 2015-02-07 LAB — HEPATITIS B SURFACE ANTIGEN: Hepatitis B Surface Ag: NEGATIVE

## 2015-02-07 LAB — HEPATITIS C ANTIBODY: HCV Ab: NEGATIVE

## 2015-02-07 LAB — RPR

## 2015-02-07 LAB — HIV ANTIBODY (ROUTINE TESTING W REFLEX): HIV: NONREACTIVE

## 2015-02-08 LAB — CYTOLOGY - PAP

## 2015-02-09 LAB — URINE CULTURE: Colony Count: 60000

## 2018-02-18 ENCOUNTER — Ambulatory Visit: Payer: Self-pay | Admitting: Obstetrics & Gynecology

## 2020-05-10 ENCOUNTER — Encounter (HOSPITAL_BASED_OUTPATIENT_CLINIC_OR_DEPARTMENT_OTHER): Payer: Self-pay | Admitting: Emergency Medicine

## 2020-05-10 ENCOUNTER — Emergency Department (HOSPITAL_BASED_OUTPATIENT_CLINIC_OR_DEPARTMENT_OTHER)
Admission: EM | Admit: 2020-05-10 | Discharge: 2020-05-10 | Disposition: A | Discharge status: Home / Self Care | Payer: Commercial Managed Care - PPO | Attending: Emergency Medicine | Admitting: Emergency Medicine

## 2020-05-10 ENCOUNTER — Emergency Department (HOSPITAL_BASED_OUTPATIENT_CLINIC_OR_DEPARTMENT_OTHER): Payer: Commercial Managed Care - PPO

## 2020-05-10 ENCOUNTER — Other Ambulatory Visit: Payer: Self-pay

## 2020-05-10 DIAGNOSIS — Z87891 Personal history of nicotine dependence: Secondary | ICD-10-CM | POA: Insufficient documentation

## 2020-05-10 DIAGNOSIS — B9689 Other specified bacterial agents as the cause of diseases classified elsewhere: Secondary | ICD-10-CM | POA: Insufficient documentation

## 2020-05-10 DIAGNOSIS — R102 Pelvic and perineal pain: Secondary | ICD-10-CM | POA: Diagnosis present

## 2020-05-10 DIAGNOSIS — R110 Nausea: Secondary | ICD-10-CM | POA: Insufficient documentation

## 2020-05-10 DIAGNOSIS — R109 Unspecified abdominal pain: Secondary | ICD-10-CM | POA: Insufficient documentation

## 2020-05-10 DIAGNOSIS — N760 Acute vaginitis: Secondary | ICD-10-CM | POA: Diagnosis not present

## 2020-05-10 LAB — COMPREHENSIVE METABOLIC PANEL
ALT: 17 U/L (ref 0–44)
AST: 20 U/L (ref 15–41)
Albumin: 4.5 g/dL (ref 3.5–5.0)
Alkaline Phosphatase: 22 U/L — ABNORMAL LOW (ref 38–126)
Anion gap: 9 (ref 5–15)
BUN: 13 mg/dL (ref 6–20)
CO2: 25 mmol/L (ref 22–32)
Calcium: 9.5 mg/dL (ref 8.9–10.3)
Chloride: 104 mmol/L (ref 98–111)
Creatinine, Ser: 0.94 mg/dL (ref 0.44–1.00)
GFR, Estimated: >60 mL/min (ref >60)
Glucose, Bld: 100 mg/dL — ABNORMAL HIGH (ref 70–99)
Potassium: 4.4 mmol/L (ref 3.5–5.1)
Sodium: 138 mmol/L (ref 135–145)
Total Bilirubin: 0.5 mg/dL (ref 0.3–1.2)
Total Protein: 6.8 g/dL (ref 6.5–8.1)

## 2020-05-10 LAB — CBC WITH DIFFERENTIAL/PLATELET
Abs Immature Granulocytes: 0.01 10*3/uL (ref 0.00–0.07)
Basophils Absolute: 0.1 10*3/uL (ref 0.0–0.1)
Basophils Relative: 1 %
Eosinophils Absolute: 0.1 10*3/uL (ref 0.0–0.5)
Eosinophils Relative: 2 %
HCT: 40.2 % (ref 36.0–46.0)
Hemoglobin: 14.3 g/dL (ref 12.0–15.0)
Immature Granulocytes: 0 %
Lymphocytes Relative: 26 %
Lymphs Abs: 1.2 10*3/uL (ref 0.7–4.0)
MCH: 31.2 pg (ref 26.0–34.0)
MCHC: 35.6 g/dL (ref 30.0–36.0)
MCV: 87.6 fL (ref 80.0–100.0)
Monocytes Absolute: 0.2 10*3/uL (ref 0.1–1.0)
Monocytes Relative: 4 %
Neutro Abs: 3.2 10*3/uL (ref 1.7–7.7)
Neutrophils Relative %: 67 %
Platelets: 252 10*3/uL (ref 150–400)
RBC: 4.59 MIL/uL (ref 3.87–5.11)
RDW: 11.4 % — ABNORMAL LOW (ref 11.5–15.5)
WBC: 4.8 10*3/uL (ref 4.0–10.5)
nRBC: 0 % (ref 0.0–0.2)

## 2020-05-10 LAB — URINALYSIS, ROUTINE W REFLEX MICROSCOPIC
Bilirubin Urine: NEGATIVE
Glucose, UA: NEGATIVE mg/dL
Hgb urine dipstick: NEGATIVE
Ketones, ur: NEGATIVE mg/dL
Leukocytes,Ua: NEGATIVE
Nitrite: NEGATIVE
Protein, ur: NEGATIVE mg/dL
Specific Gravity, Urine: 1.01 (ref 1.005–1.030)
pH: 7 (ref 5.0–8.0)

## 2020-05-10 LAB — PREGNANCY, URINE: Preg Test, Ur: NEGATIVE

## 2020-05-10 LAB — WET PREP, GENITAL
Sperm: NONE SEEN
Trich, Wet Prep: NONE SEEN
Yeast Wet Prep HPF POC: NONE SEEN

## 2020-05-10 LAB — LIPASE, BLOOD: Lipase: 36 U/L (ref 11–51)

## 2020-05-10 MED ORDER — DOXYCYCLINE HYCLATE 100 MG PO CAPS: 100.0000 mg | ORAL_CAPSULE | Freq: Two times a day (BID) | ORAL | 0 refills | Status: AC

## 2020-05-10 MED ORDER — CEFTRIAXONE SODIUM 500 MG IJ SOLR
500.0000 mg | Freq: Once | INTRAMUSCULAR | Status: AC
  Administered 2020-05-10: 500 mg via INTRAMUSCULAR
  Filled 2020-05-10: qty 500

## 2020-05-10 MED ORDER — LIDOCAINE HCL (PF) 1 % IJ SOLN
1.0000 mL | Freq: Once | INTRAMUSCULAR | Status: AC
  Administered 2020-05-10: 1 mL
  Filled 2020-05-10: qty 5

## 2020-05-10 NOTE — ED Provider Notes (Signed)
[facility] EMERGENCY DEPARTMENT Provider Note   CSN: [id number] Arrival date & time: 05/10/20  1026     History Chief Complaint  Patient presents with  . Pelvic Pain    Patricia BuddHaley J Creson is a 30 y.o. female who presents to the ED today with complaint of pelvis pain/pressure for the past 2 weeks.  Patient reports that 2 weeks ago she noticed some urinary urgency and thought she had a UTI.  She went to urgent care and had a urinalysis performed which does not show any abnormalities.  She reports that earlier this week she began having worsening symptoms as well as left-sided flank pain and was worried that she could have a kidney infection.  She went back to the same urgent care and requested additional work-up at that time.  She also reports a history of STDs and requested a thick exam with STD checkup.  She reports that she was told that her pH was slightly elevated and she was started on Flagyl however still awaiting her results of BV.  She states that she has had her results back regarding TDs and states that they were negative.  She reports no improvement after the Flagyl and continues to have pain.  She does mention she has a Mirena IUD in place and states that it has been present for approximately 7 years and she knows that it needs to be taken out.  She is unsure if this could be causing her pain.  She does report she has been slightly nauseated however no vomiting.  She felt feverish last night however did not take her temperature as her kids misplaced it.  No chills.  She is not currently feeling febrile.  She denies any previous past surgical history to abdomen.  Last normal menstrual period 02/14.   The history is provided by the patient and medical records.       Past Medical History:  Diagnosis Date  . Narcotic abuse (HCC)    currently taking methadone  . Urinary tract infection     Patient Active Problem List   Diagnosis Date Noted  . Narcotic dependence (HCC)  04/12/2012    Past Surgical History:  Procedure Laterality Date  . INDUCED ABORTION    . WISDOM TOOTH EXTRACTION       OB History    Gravida  3   Para  1   Term  1   Preterm  0   AB  2   Living  1     SAB  1   IAB  1   Ectopic      Multiple      Live Births  1           Family History  Problem Relation Age of Onset  . Heart disease Father        valve replaced  . Cancer Maternal Grandmother        lung  . Multiple sclerosis Paternal Grandmother     Social History   Tobacco Use  . Smoking status: Former Smoker    Packs/day: 0.50    Types: Cigarettes  . Smokeless tobacco: Former NeurosurgeonUser    Quit date: 04/03/2012  Substance Use Topics  . Alcohol use: No    Alcohol/week: 0.0 standard drinks  . Drug use: Yes    Types: IV, Opium    Comment: on methadone    Home Medications Prior to Admission medications   Medication Sig Start Date End Date Taking?  Authorizing Provider  doxycycline (VIBRAMYCIN) 100 MG capsule Take 1 capsule (100 mg total) by mouth 2 (two) times daily for 14 days. 05/10/20 05/24/20 Yes Ercell Perlman, PA-C  ibuprofen (ADVIL,MOTRIN) 600 MG tablet Take 1 tablet (600 mg total) by mouth every 6 (six) hours. 11/20/12   Arabella Merles, CNM  levonorgestrel (MIRENA) 20 MCG/24HR IUD 1 each by Intrauterine route once.    [provider]  methadone (DOLOPHINE) 0.4 mg/mL SOLN Take 15 mg by mouth daily. Patient receives this medication from [facility]    [provider]  Multiple Vitamin (MULTIVITAMIN WITH MINERALS) TABS tablet Take 1 tablet by mouth daily.    [provider]  sulfamethoxazole-trimethoprim (BACTRIM DS,SEPTRA DS) 800-160 MG tablet Take 1 tablet by mouth 2 (two) times daily. 02/06/15   Tereso Newcomer, MD    Allergies    Patient has no known allergies.  Review of Systems   Review of Systems  Constitutional: Positive for fever. Negative for chills.  Gastrointestinal: Positive for  abdominal pain and nausea. Negative for vomiting.  Genitourinary: Positive for flank pain and urgency. Negative for vaginal bleeding.  All other systems reviewed and are negative.   Physical Exam Updated Vital Signs BP 134/84 (BP Location: Left Arm)   Pulse 92   Temp 98.5 F (36.9 C) (Oral)   Resp 18   Ht 5\' 4"  (1.626 m)   Wt 59.4 kg   SpO2 99%   BMI 22.48 kg/m   Physical Exam Vitals and nursing note reviewed.  Constitutional:      Appearance: She is not ill-appearing or diaphoretic.  HENT:     Head: Normocephalic and atraumatic.  Eyes:     Conjunctiva/sclera: Conjunctivae normal.  Cardiovascular:     Rate and Rhythm: Normal rate and regular rhythm.  Pulmonary:     Effort: Pulmonary effort is normal.     Breath sounds: Normal breath sounds. No wheezing, rhonchi or rales.  Abdominal:     Palpations: Abdomen is soft.     Tenderness: There is abdominal tenderness. There is no right CVA tenderness, left CVA tenderness, guarding or rebound.     Comments: Soft, mild diffuse lower abdominal TTP, +BS throughout, no r/g/r, neg murphy's, neg mcburney's, no CVA TTP  Genitourinary:    Comments: Chaperone present for exam. No rashes, lesions, or tenderness to external genitalia. No erythema, injury, or tenderness to vaginal mucosa. No vaginal discharge or bleeding within vaginal vault. Mild bilateral adnexal TTP and CMT. Cervical os is closed. Uterus non-deviated, mobile, nonTTP, and without enlargement.  Musculoskeletal:     Cervical back: Neck supple.  Skin:    General: Skin is warm and dry.  Neurological:     Mental Status: She is alert.     ED Results / Procedures / Treatments   Labs (all labs ordered are listed, but only abnormal results are displayed) Labs Reviewed  WET PREP, GENITAL - Abnormal; Notable for the following components:      Result Value   Clue Cells Wet Prep HPF POC PRESENT (*)    WBC, Wet Prep HPF POC MANY (*)    All other components within normal limits   COMPREHENSIVE METABOLIC PANEL - Abnormal; Notable for the following components:   Glucose, Bld 100 (*)    Alkaline Phosphatase 22 (*)    All other components within normal limits  CBC WITH DIFFERENTIAL/PLATELET - Abnormal; Notable for the following components:   RDW 11.4 (*)    All other components  within normal limits  LIPASE, BLOOD  URINALYSIS, ROUTINE W REFLEX MICROSCOPIC  PREGNANCY, URINE  GC/CHLAMYDIA PROBE AMP ([place]) NOT AT [facility]    EKG None  Radiology US PELVIC COMPLETE W TRANSVAGINAL AND TORSION R/O  Result Date: 05/10/2020 CLINICAL DATA:  Pelvic pain of unspecified duration, LMP 04/02/2020 EXAM: TRANSABDOMINAL AND TRANSVAGINAL ULTRASOUND OF PELVIS DOPPLER ULTRASOUND OF OVARIES TECHNIQUE: Both transabdominal and transvaginal ultrasound examinations of the pelvis were performed. Transabdominal technique was performed for global imaging of the pelvis including uterus, ovaries, adnexal regions, and pelvic cul-de-sac. It was necessary to proceed with endovaginal exam following the transabdominal exam to visualize the uterus and endometrium,. Color and duplex Doppler ultrasound was utilized to evaluate blood flow to the ovaries. COMPARISON:  11/04/2013 FINDINGS: Uterus Measurements: 6.6 x 3.7 x 4.8 cm = volume: 62 mL. Anteflexed. Normal morphology without mass. Tiny nabothian cyst in cervix. Endometrium Thickness: 4 mm. No endometrial fluid or mass. IUD in expected position at upper uterine segment endometrial canal. Right ovary Measurements: 2.3 x 1.3 x 1.4 cm = volume: 2.1 mL. Normal morphology without mass Left ovary Measurements: 2.7 x 1.2 x 2.1 cm = volume: 3.6 mL. Normal morphology without mass Pulsed Doppler evaluation of both ovaries demonstrates normal low-resistance arterial and venous waveforms. Other findings No free pelvic fluid or adnexal masses. IMPRESSION: IUD in expected position at the upper uterine segment endometrial canal. Otherwise normal exam. No evidence of  ovarian mass or torsion. Electronically Signed   By: Ulyses Southward M.D.   On: 05/10/2020 14:26    Procedures Procedures   Medications Ordered in ED Medications  cefTRIAXone (ROCEPHIN) injection 500 mg (has no administration in time range)    ED Course  I have reviewed the triage vital signs and the nursing notes.  Pertinent labs & imaging results that were available during my care of the patient were reviewed by me and considered in my medical decision making (see chart for details).    MDM Rules/Calculators/A&P                          30 year old female presents to the ED today with a new complaint of pelvic pressure, pain with urgency for the last 2 weeks.  Seen twice at urgent care with negative results besides positive for BV, currently on Flagyl without relief.  She does have a Mirena IUD in place that has been present for 7 years.  She is sexually active with one female partner.  On arrival to the ED vitals are stable.  Patient appears to be in no acute distress.  On exam she does have some mild lower abdominal tenderness palpation.  No previous past surgical history.  We will plan for lab work at this time as well as repeat pelvic exam per patient's request as well as likely pelvic ultrasound given pain however will assess after pelvic exam.  Question if her Mirena that needed to be taken out 2 years ago is causing pain at this time.  She was negative for gonorrhea and chlamydia at urgent care.  CBC without leukocytosis. Hgb stable at 14.3 CMP without electrolyte abnormalities Lipase 36 UPT negative U/A without signs of infection   Pelvic exam performed; pt with diffuse TTP to both adnexa and CMT however her gonorrhea and chlamydia test were normal at UC last week. Will wait ultrasound results.   Wet prep positive for clue cells.   Ultrasound:   IMPRESSION:  IUD in expected position  at the upper uterine segment endometrial  canal.    Otherwise normal exam.    No evidence  of ovarian mass or torsion.    Ultrasound reassuring at this time and no abnormalities on labwork however given CMT will cover for PID today with doxycycline x 14 days and rocephin IM here. She is currently on Flagyl for her BV. Pt has an appointment with OBGYN on April 5th; advised to keep. She is instructed to return for any worsening symptoms. Stable for discharge home.   This note was prepared using Dragon voice recognition software and may include unintentional dictation errors due to the inherent limitations of voice recognition software.   Final Clinical Impression(s) / ED Diagnoses Final diagnoses:  Pelvic pain in female  Bacterial vaginosis    Rx / DC Orders ED Discharge Orders         Ordered    doxycycline (VIBRAMYCIN) 100 MG capsule  2 times daily        05/10/20 1449           Discharge Instructions     Please pick up medication and take as prescribed We have retested you for gonorrhea and chlamydia - you can check on your results via mychart in 2-3 days. We will also call you if you test positive.   Follow up with an OBGYN as scheduled for beginning of April  Return to the ED for any worsening symptoms       Tanda Rockers, PA-C 05/10/20 1451    Vanetta Mulders, MD 05/12/20 [phone]

## 2020-05-10 NOTE — Discharge Instructions (Addendum)
Please pick up medication and take as prescribed We have retested you for gonorrhea and chlamydia - you can check on your results via mychart in 2-3 days. We will also call you if you test positive.   Follow up with an OBGYN as scheduled for beginning of April  Return to the ED for any worsening symptoms

## 2020-05-10 NOTE — ED Notes (Signed)
Pt not in room and will get vitals after pt return from [place]

## 2020-05-10 NOTE — ED Triage Notes (Signed)
Reports being seen two weeks ago for what she thought was UTI.  Was told it was negative. Continued to have pain so seen again at [facility] on Monday and retested urine and checked STI's, BV, and vaginitis.  Waiting for results but currently taking flagyl.

## 2020-05-11 LAB — GC/CHLAMYDIA PROBE AMP (~~LOC~~) NOT AT ARMC
Chlamydia: NEGATIVE
Comment: NEGATIVE
Comment: NORMAL
Neisseria Gonorrhea: NEGATIVE

## 2022-05-27 IMAGING — US US PELVIS COMPLETE TRANSABD/TRANSVAG W DUPLEX
1 series · 13 of 25 positions shown · non-contrast
Comparison: 11/04/2013

CLINICAL DATA: Pelvic pain of unspecified duration, LMP 04/02/2020

EXAM:
TRANSABDOMINAL AND TRANSVAGINAL ULTRASOUND OF PELVIS
DOPPLER ULTRASOUND OF OVARIES
TECHNIQUE: Both transabdominal and transvaginal ultrasound examinations of the
pelvis were performed. Transabdominal technique was performed for
global imaging of the pelvis including uterus, ovaries, adnexal
regions, and pelvic cul-de-sac.
It was necessary to proceed with endovaginal exam following the
transabdominal exam to visualize the uterus and endometrium,. Color
and duplex Doppler ultrasound was utilized to evaluate blood flow to
the ovaries.

[Series 1: us pelvis complete transabd/transvag w duplex · 13 of 90 slices shown]
[im 1/90]
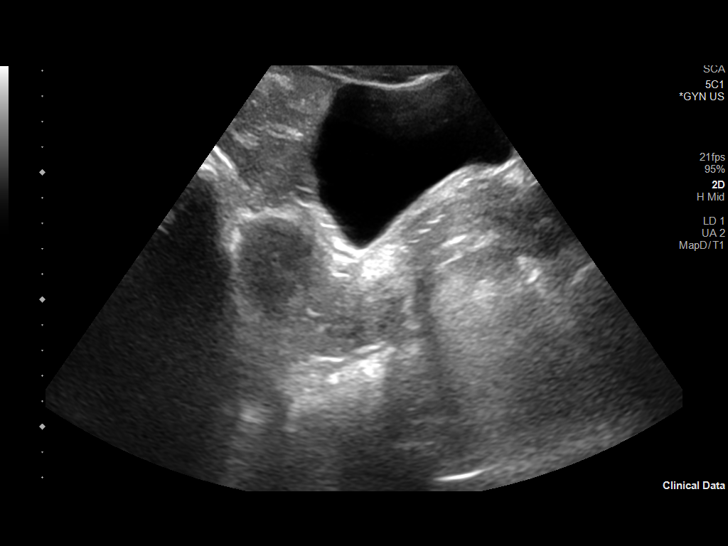
[im 8/90]
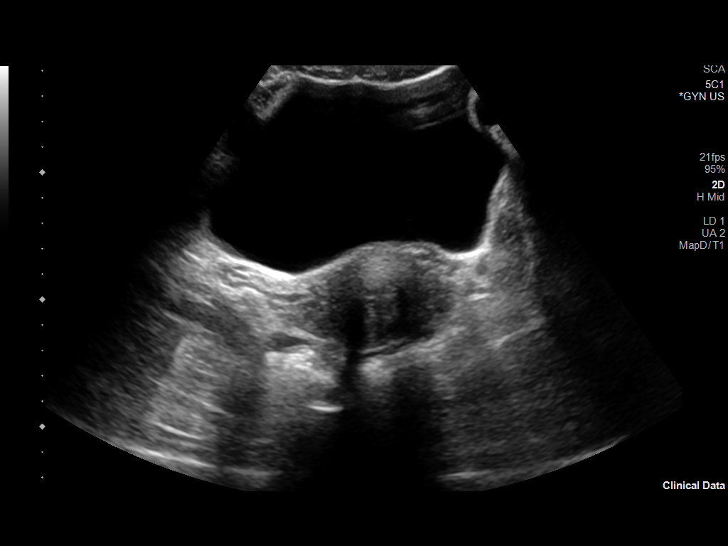
[im 15/90]
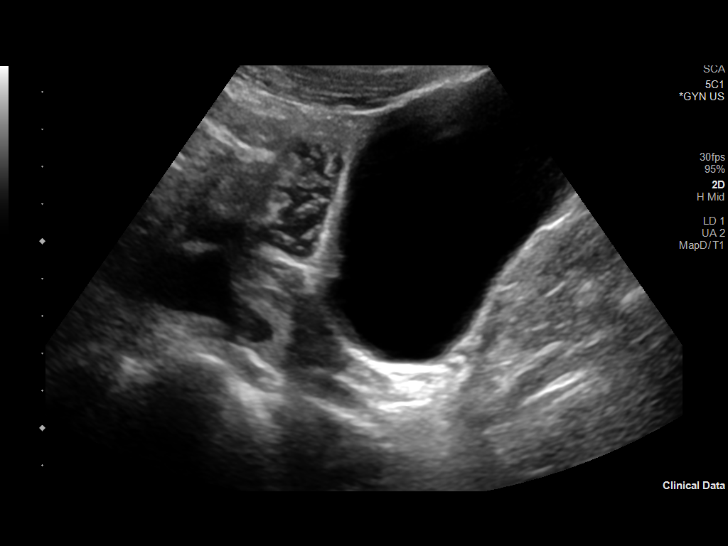
[im 23/90]
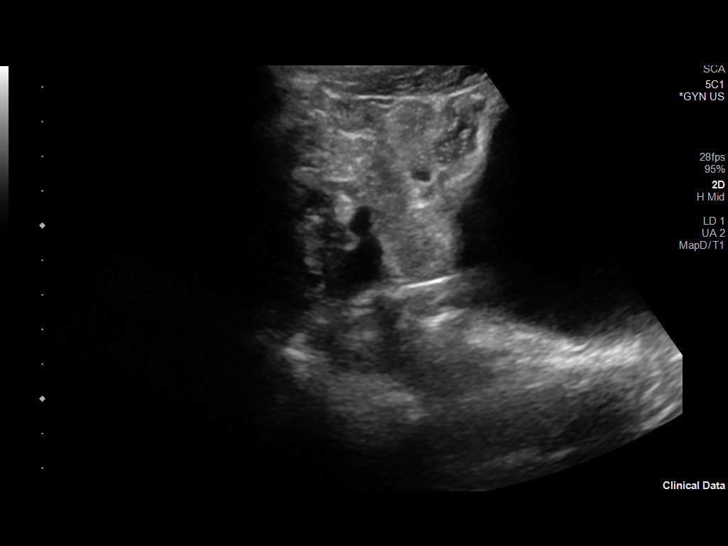
[im 30/90]
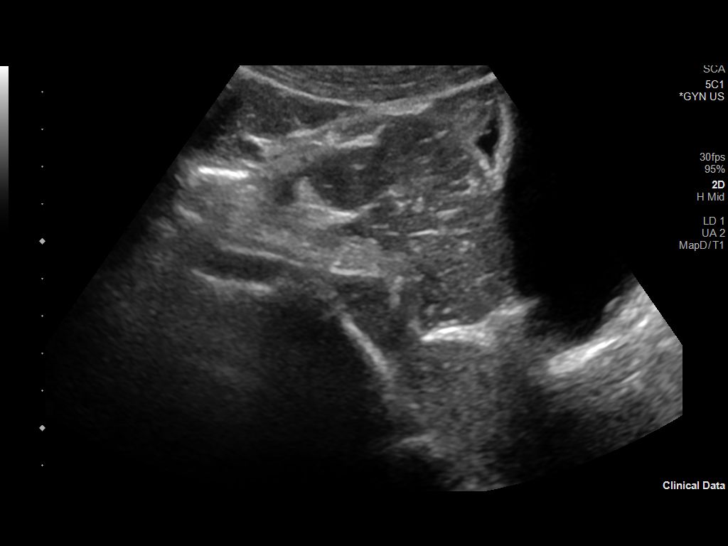
[im 38/90]
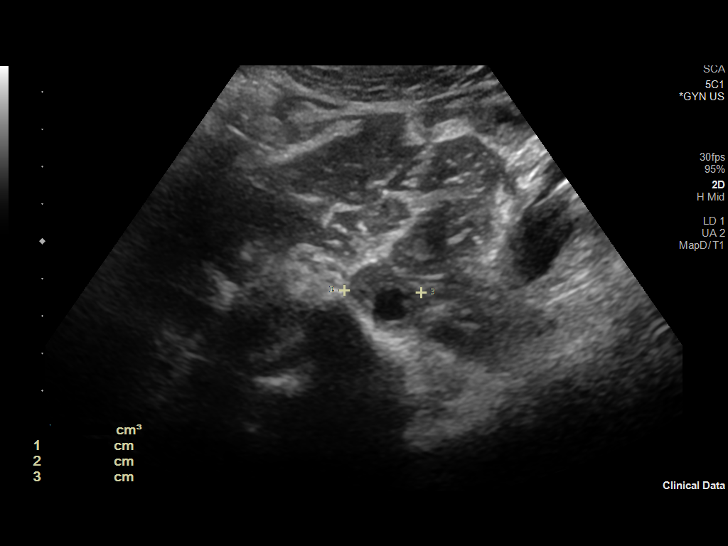
[im 45/90]
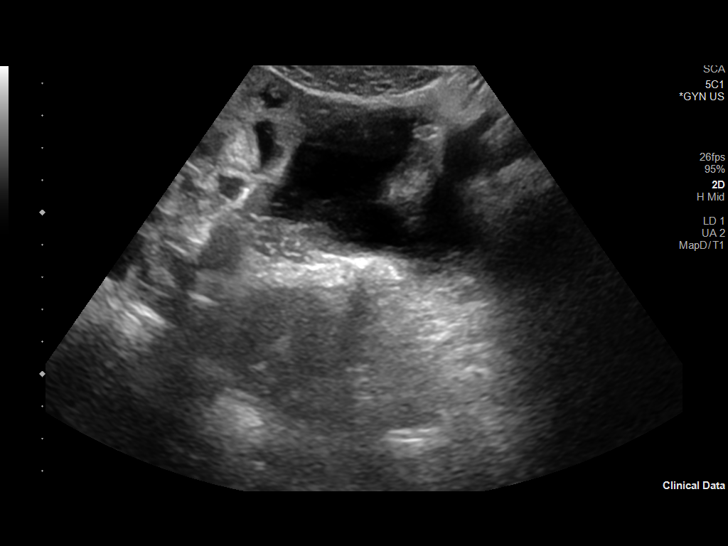
[im 52/90]
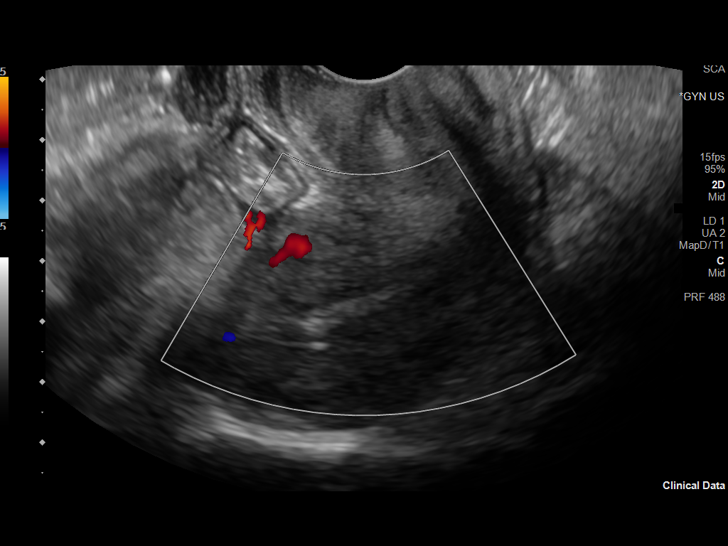
[im 60/90]
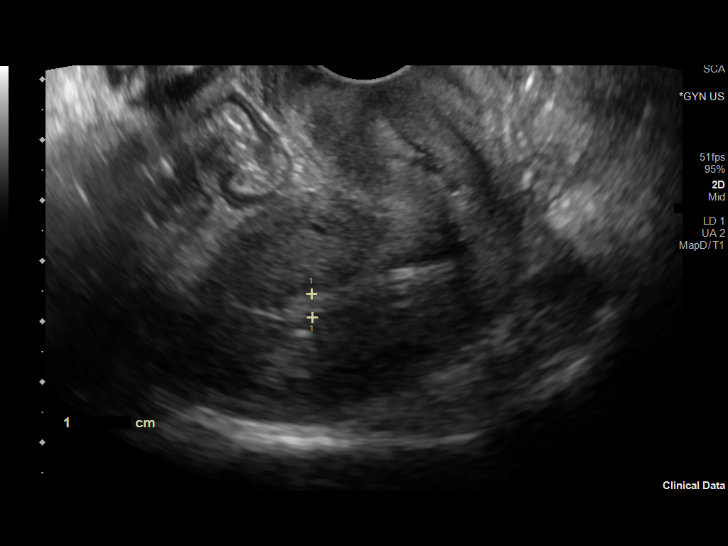
[im 67/90]
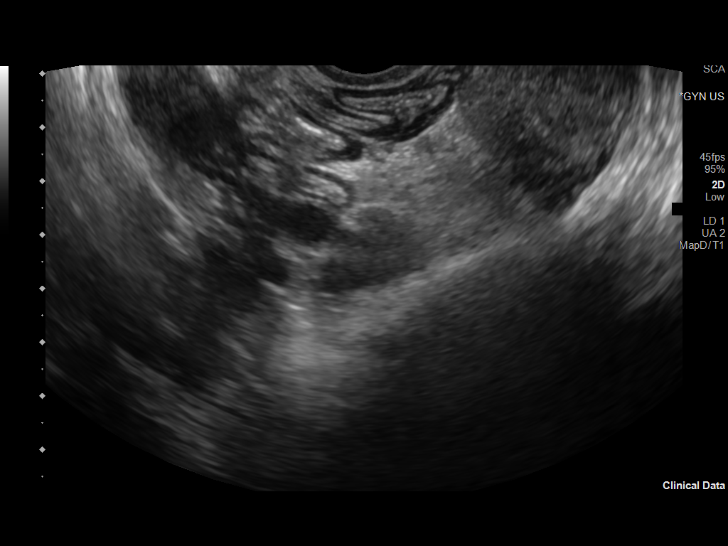
[im 75/90]
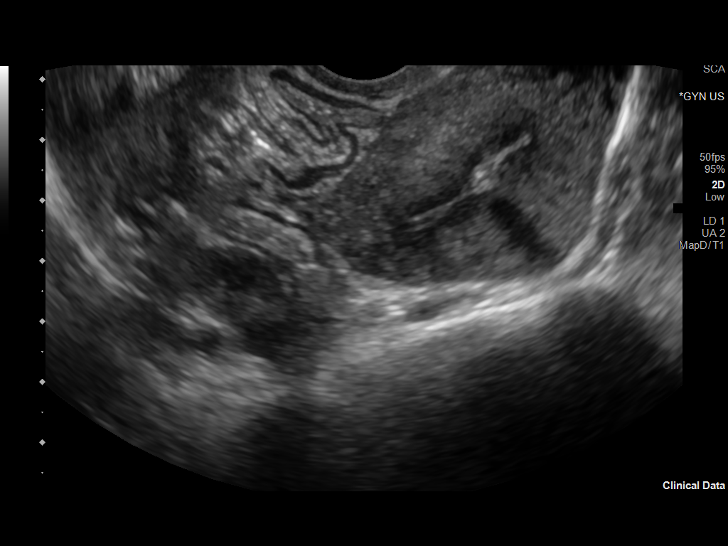
[im 82/90]
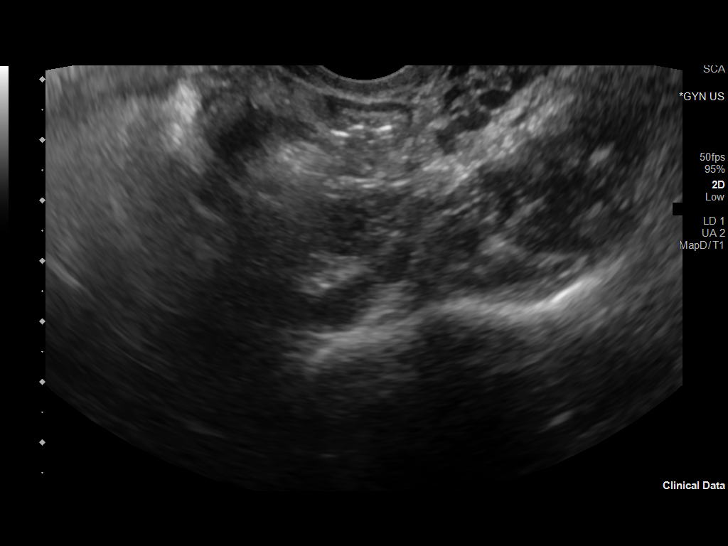
[im 90/90]
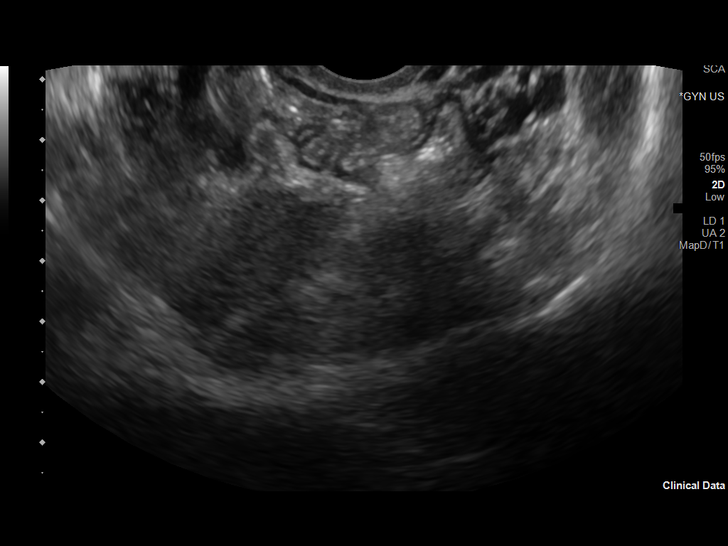

[13 of 25 positions shown; findings below may reference images not displayed]

FINDINGS: Uterus

Measurements: 6.6 x 3.7 x 4.8 cm = volume: 62 mL. Anteflexed. Normal
morphology without mass. Tiny nabothian cyst in cervix.

Endometrium

Thickness: 4 mm. No endometrial fluid or mass. IUD in expected
position at upper uterine segment endometrial canal.

Right ovary

Measurements: 2.3 x 1.3 x 1.4 cm = volume: 2.1 mL. Normal morphology
without mass

Left ovary

Measurements: 2.7 x 1.2 x 2.1 cm = volume: 3.6 mL. Normal morphology
without mass

Pulsed Doppler evaluation of both ovaries demonstrates normal
low-resistance arterial and venous waveforms.

Other findings

No free pelvic fluid or adnexal masses.
IMPRESSION: IUD in expected position at the upper uterine segment endometrial
canal.

Otherwise normal exam.

No evidence of ovarian mass or torsion.
